# Patient Record
Sex: Male | Born: 1950 | Smoking: Never smoker
Health system: Southern US, Academic
[De-identification: ages and names within clinical notes are randomized; demographics above are authoritative.]

## PROBLEM LIST (undated history)

## (undated) DIAGNOSIS — L299 Pruritus, unspecified: Secondary | ICD-10-CM

## (undated) DIAGNOSIS — E785 Hyperlipidemia, unspecified: Secondary | ICD-10-CM

## (undated) DIAGNOSIS — I10 Essential (primary) hypertension: Secondary | ICD-10-CM

## (undated) DIAGNOSIS — F429 Obsessive-compulsive disorder, unspecified: Secondary | ICD-10-CM

## (undated) DIAGNOSIS — L210 Seborrhea capitis: Secondary | ICD-10-CM

## (undated) DIAGNOSIS — N3281 Overactive bladder: Secondary | ICD-10-CM

## (undated) HISTORY — DX: Hyperlipidemia, unspecified: E78.5

## (undated) HISTORY — DX: Seborrhea capitis: L21.0

## (undated) HISTORY — DX: Overactive bladder: N32.81

## (undated) HISTORY — PX: GASTRIC BYPASS: SHX52

## (undated) HISTORY — DX: Obsessive-compulsive disorder, unspecified: F42.9

## (undated) HISTORY — DX: Pruritus, unspecified: L29.9

## (undated) HISTORY — DX: Essential (primary) hypertension: I10

## (undated) HISTORY — PX: VASECTOMY: SHX75

---

## 2009-10-09 DIAGNOSIS — D352 Benign neoplasm of pituitary gland: Secondary | ICD-10-CM | POA: Insufficient documentation

## 2011-07-19 DIAGNOSIS — L299 Pruritus, unspecified: Secondary | ICD-10-CM | POA: Insufficient documentation

## 2011-11-26 DIAGNOSIS — F429 Obsessive-compulsive disorder, unspecified: Secondary | ICD-10-CM | POA: Insufficient documentation

## 2012-02-03 ENCOUNTER — Ambulatory Visit (INDEPENDENT_AMBULATORY_CARE_PROVIDER_SITE_OTHER): Payer: Medicare Other | Admitting: Family Medicine

## 2012-02-03 ENCOUNTER — Encounter: Payer: Self-pay | Admitting: Family Medicine

## 2012-02-03 VITALS — BP 114/76 | HR 81 | Ht 70.0 in | Wt 280.0 lb

## 2012-02-03 DIAGNOSIS — L299 Pruritus, unspecified: Secondary | ICD-10-CM

## 2012-02-03 DIAGNOSIS — L0212 Furuncle of neck: Secondary | ICD-10-CM

## 2012-02-03 DIAGNOSIS — L210 Seborrhea capitis: Secondary | ICD-10-CM | POA: Insufficient documentation

## 2012-02-03 HISTORY — DX: Seborrhea capitis: L21.0

## 2012-02-03 HISTORY — DX: Pruritus, unspecified: L29.9

## 2012-02-03 MED ORDER — MUPIROCIN 2 % EX OINT: TOPICAL_OINTMENT | CUTANEOUS | Status: DC

## 2012-02-03 MED ORDER — KETOCONAZOLE 2 % EX SHAM: MEDICATED_SHAMPOO | CUTANEOUS | Status: DC

## 2012-02-03 MED ORDER — SULFAMETHOXAZOLE-TRIMETHOPRIM 800-160 MG PO TABS: 1.0000 | ORAL_TABLET | Freq: Two times a day (BID) | ORAL | Status: AC

## 2012-02-03 MED ORDER — COAL TAR EXTRACT 0.5 % EX SHAM: MEDICATED_SHAMPOO | Freq: Every evening | CUTANEOUS | Status: DC | PRN

## 2012-02-03 MED ORDER — HYDROCODONE-ACETAMINOPHEN 5-325 MG PO TABS: 1.0000 | ORAL_TABLET | Freq: Four times a day (QID) | ORAL | Status: DC | PRN

## 2012-02-03 NOTE — Progress Notes (Signed)
Incision and Drainage Procedure Note  Pre-operative Diagnosis: Abscess of the neck  Post-operative Diagnosis: same  Indications: Infection  Anesthesia: 2% plain lidocaine with epinephrine  Procedure Details  The procedure, risks and complications have been discussed in detail (including, but not limited to airway compromise, infection, bleeding) with the patient, and the patient has signed consent to the procedure.  The skin was sterilely prepped and draped over the affected area in the usual fashion. After adequate local anesthesia, I&D with a #11 blade was performed on the abscess on the posterior neck Purulent drainage: present The patient was observed until stable.  Findings: Abscess of the neck  EBL: Less than 10  cc's  Drains: Iodoform gauze packing the wound  Condition: Tolerated procedure well   Complications: none.

## 2012-02-03 NOTE — Progress Notes (Signed)
  Subjective:    Patient ID: Kevin Stuart, male    DOB: 08-10-1951, 61 y.o.   MRN: [id number]  HPI Patient is here because for couple of issues.  #1 he's had a lesion in the backs neck that he states is been there for a few weeks. Every time he thinks things are getting better to notice some blood and pus on his pillowcase. There is some discomfort and pain he denies any fever  #2 he reports having itching and pruritus is quite noticeable. He states that he's been to the hypnotic therapist, dermatologist, previous primary care doctor and has been on multiple medications without improvement of the itching and scratching.   Review of Systems  Skin: Positive for rash.       Objective:   Physical Exam  Constitutional: He appears well-developed.  HENT:  Head: Normocephalic.  Neck: Neck supple. Erythema present. No edema present.         Abscess on the lower neck almost midline. There is a prominent scab present but the abscess is still rather fluctuant at this time.  Skin: Skin is warm. No rash noted. He is not diaphoretic. There is erythema. No pallor.       There is no particular rash present but no marked excoriations over his chest and back were used and scratching himself. Mild seborrhea is present in the scalp.          Assessment & Plan:  #1 abscess. See procedure note I&D performed packing placed. Patient is to have packing removed tomorrow evening. After the packing is been removed Bactroban ointment twice a day. Started on Septra DS one tablet twice a day culture the wound obtained #2 seborrhea dermatitis Nizoral shampoo after using T. gel shampoo 2-3 times a week need each one on about 5- 10 minutes. While  confident about the results warned him that it will probably take several weeks for improvement and resolution.

## 2012-02-03 NOTE — Patient Instructions (Signed)
Incision and Drainage of Abscess An abscess (boil or furuncle) is an area infected by germs that contains a collection of pus. Signs and problems (symptoms) of an abscess include pain, tenderness, redness, or hardness. You may feel a moveable, soft area under your skin. An abscess can occur anywhere in the body. Occasionally, this may spread to surrounding tissues causing cellulitis. Sometimes, a surgeon may make a cut (incision) over your abscess. The pus is drained. Gauze may be packed into the space to provide a drain. Keeping a drain or piece of gauze in the incision keeps the skin from healing first. This helps stop the abscess from forming again. The area may be painful for 5 to 7 days. Most people with an abscess do not have high fevers. If seen early, your abscess may not have localized and may not be cut. If it does not get better on its own or with medicines, you may require another appointment. HOME CARE INSTRUCTIONS   Only take over-the-counter or prescription medicines for pain, discomfort, or fever as directed by your caregiver. Use these only if your caregiver has not given medicines that would interfere.   When you bathe, remove the gauze drain after soaking. You may then wash the wound gently with mild, soapy water. Repack with gauze as your caregiver directs.   See your caregiver as directed for a recheck.   If antibiotics were prescribed, take them as directed.  SEEK MEDICAL CARE IF:   You develop increased pain, swelling, redness, drainage, or bleeding in the wound site.   You develop signs of generalized infection, including muscle aches, chills, or a general ill feeling.   You or your child has an oral temperature above 102 F (38.9 C).  MAKE SURE YOU:   Understand these instructions.   Will watch your condition.   Will get help right away if you are not doing well or get worse.  Document Released: 03/30/2001 Document Revised: 06/16/2011 Document Reviewed:  05/24/2008 [facility] Patient Information 2012 [place], [place].Abscess An abscess (boil or furuncle) is an infected area that contains a collection of pus.  SYMPTOMS Signs and symptoms of an abscess include pain, tenderness, redness, or hardness. You may feel a moveable soft area under your skin. An abscess can occur anywhere in the body.  TREATMENT  A surgical cut (incision) may be made over your abscess to drain the pus. Gauze may be packed into the space or a drain may be looped through the abscess cavity (pocket). This provides a drain that will allow the cavity to heal from the inside outwards. The abscess may be painful for a few days, but should feel much better if it was drained.  Your abscess, if seen early, may not have localized and may not have been drained. If not, another appointment may be required if it does not get better on its own or with medications. HOME CARE INSTRUCTIONS   Only take over-the-counter or prescription medicines for pain, discomfort, or fever as directed by your caregiver.   Take your antibiotics as directed if they were prescribed. Finish them even if you start to feel better.   Keep the skin and clothes clean around your abscess.   If the abscess was drained, you will need to use gauze dressing to collect any draining pus. Dressings will typically need to be changed 3 or more times a day.   The infection may spread by skin contact with others. Avoid skin contact as much as possible.  Practice good hygiene. This includes regular hand washing, cover any draining skin lesions, and do not share personal care items.   If you participate in sports, do not share athletic equipment, towels, whirlpools, or personal care items. Shower after every practice or tournament.   If a draining area cannot be adequately covered:   Do not participate in sports.   Children should not participate in day care until the wound has healed or drainage stops.   If your caregiver  has given you a follow-up appointment, it is very important to keep that appointment. Not keeping the appointment could result in a much worse infection, chronic or permanent injury, pain, and disability. If there is any problem keeping the appointment, you must call back to this facility for assistance.  SEEK MEDICAL CARE IF:   You develop increased pain, swelling, redness, drainage, or bleeding in the wound site.   You develop signs of generalized infection including muscle aches, chills, fever, or a general ill feeling.   You have an oral temperature above 102 F (38.9 C).  MAKE SURE YOU:   Understand these instructions.   Will watch your condition.   Will get help right away if you are not doing well or get worse.  Document Released: 07/14/2005 Document Revised: 09/23/2011 Document Reviewed: 05/07/2008 [facility] Patient Information 2012 [place], [place].Seborrheic Dermatitis Seborrheic dermatitis involves pink or red skin with greasy, flaky scales. This is often found on the scalp, eyebrows, nose, bearded area, and on or behind the ears. It can also occur on the central chest. It often occurs where there are more oil (sebaceous) glands. This condition is also known as dandruff. When this condition affects a baby's scalp, it is called cradle cap. It may come and go for no known reason. It can occur at any time of life from infancy to old age. CAUSES  The cause is unknown. It is not the result of too little moisture or too much oil. In some people, seborrheic dermatitis flare-ups seem to be triggered by stress. It also commonly occurs in people with certain diseases such as Parkinson's disease or HIV/AIDS. SYMPTOMS   Thick scales on the scalp.   Redness on the face or in the armpits.   The skin may seem oily or dry, but moisturizers do not help.   In infants, seborrheic dermatitis appears as scaly redness that does not seem to bother the baby. In some babies, it affects only the scalp.  In others, it also affects the neck creases, armpits, groin, or behind the ears.   In adults and adolescents, seborrheic dermatitis may affect only the scalp. It may look patchy or spread out, with areas of redness and flaking. Other areas commonly affected include:   Eyebrows.   Eyelids.   Forehead.   Skin behind the ears.   Outer ears.   Chest.   Armpits.   Nose creases.   Skin creases under the breasts.   Skin between the buttocks.   Groin.   Some adults and adolescents feel itching or burning in the affected areas.  DIAGNOSIS  Your caregiver can usually tell what the problem is by doing a physical exam. TREATMENT   Cortisone (steroid) ointments, creams, and lotions can help decrease inflammation.   Babies can be treated with baby oil to soften the scales, then they may be washed with baby shampoo. If this does not help, medicated shampoos should work.   Adults can also use medicated shampoos.   Your caregiver may  prescribe corticosteroid cream and shampoo containing an antifungal or yeast medicine (ketoconazole). Hydrocortisone or anti-yeast cream can be rubbed directly onto seborrheic dermatitis patches. Yeast does not cause seborrheic dermatitis, but it seems to add to the problem.  In infants, seborrheic dermatitis is often worst during the first year of life. It tends to disappear on its own as the child grows. However, it may return during the teenage years. In adults and adolescents, seborrheic dermatitis tends to be a long-lasting condition that comes and goes over many years. HOME CARE INSTRUCTIONS   Use prescribed medicines as directed.   In infants, do not aggressively remove the scales or flakes on the scalp with a comb or by other means. This may lead to hair loss.  SEEK MEDICAL CARE IF:   The problem does not improve from the medicated shampoos, lotions, or other medicines given by your caregiver.   You have any other questions or concerns.  Document  Released: 10/04/2005 Document Revised: 09/23/2011 Document Reviewed: 02/23/2010 [facility] Patient Information 2012 [place], [place].

## 2012-02-04 ENCOUNTER — Ambulatory Visit (INDEPENDENT_AMBULATORY_CARE_PROVIDER_SITE_OTHER): Payer: Medicare Other | Admitting: Family Medicine

## 2012-02-04 ENCOUNTER — Encounter: Payer: Self-pay | Admitting: Family Medicine

## 2012-02-04 VITALS — BP 109/68 | HR 59 | Ht 70.0 in | Wt 278.0 lb

## 2012-02-04 DIAGNOSIS — IMO0002 Reserved for concepts with insufficient information to code with codable children: Secondary | ICD-10-CM

## 2012-02-04 DIAGNOSIS — L299 Pruritus, unspecified: Secondary | ICD-10-CM

## 2012-02-04 DIAGNOSIS — Z9889 Other specified postprocedural states: Secondary | ICD-10-CM

## 2012-02-04 NOTE — Progress Notes (Signed)
  Subjective:    Patient ID: Kevin Stuart, male    DOB: 08/14/1951, 61 y.o.   MRN: [id number]  HPI The patient had a cyst removed from his neck yesterday by Dr. Thurmond Butts . He's here to have the wound re-evaluated and the gauze removed. He says it's tender but otherwise is doing well. Only minimal driange. No fever.    He also has some questions about his diffuse skin itching that has been chronic and going on for years. He seen multiple providers including allergists and dermatologists for it before. He has questions about DT consult shampoo as well as the tar shampoo application. He wants to think it will work. He will is basically asking me for my opinion. He never has any rash. He denies any redness or flaking of the scalp or skin.   Review of Systems     Objective:   Physical Exam  Skin:       No apparent rash on his back or chest or scalp. He has multiple excoriations across his abdomen. And some scarring from previous excoriations.    Review of gauze from the wound. It looks like it's healing well. He is still tender. No active pus or drainage at the gauze was removed. No evidence of cellulitis. I did repack the wound with a little less gauze. A used quarter inch. I left a tail on the gauze and it was covered with a dressing.      Assessment & Plan:  Wound, status post cyst removal-New gauze was placed today. I asked him if he had a family member or someone that can help her make the calls for him on Sunday and he said he would rather come in on Monday for have it removed. He can follow up on Monday to have it removed.  Pruritis - I do agree with Dr. Warden Fillers assessment that this could be seborrheic dermatitis. I discussed that sometimes this is caused by a small fungus that can live on the skin and I really do think it is worthwhile for him to at least consider and try this particular treatment. I reviewed the information that Dr. Thurmond Butts had discussed with him and encouraged him to try the  treatment regimen since he has already tried so many other things. Also patient does have a history of obsessive-compulsive disorder and certainly this could be completely psychological.

## 2012-02-07 ENCOUNTER — Ambulatory Visit: Payer: Medicare Other | Admitting: Family Medicine

## 2012-02-07 LAB — WOUND CULTURE

## 2012-02-08 ENCOUNTER — Encounter: Payer: Self-pay | Admitting: Family Medicine

## 2012-02-08 ENCOUNTER — Ambulatory Visit (INDEPENDENT_AMBULATORY_CARE_PROVIDER_SITE_OTHER): Payer: Medicare Other | Admitting: Family Medicine

## 2012-02-08 ENCOUNTER — Telehealth: Payer: Self-pay | Admitting: *Deleted

## 2012-02-08 VITALS — BP 113/72 | HR 71 | Ht 70.0 in | Wt 282.0 lb

## 2012-02-08 DIAGNOSIS — L0211 Cutaneous abscess of neck: Secondary | ICD-10-CM

## 2012-02-08 DIAGNOSIS — I10 Essential (primary) hypertension: Secondary | ICD-10-CM

## 2012-02-08 DIAGNOSIS — L299 Pruritus, unspecified: Secondary | ICD-10-CM

## 2012-02-08 DIAGNOSIS — Z2839 Other underimmunization status: Secondary | ICD-10-CM

## 2012-02-08 DIAGNOSIS — E785 Hyperlipidemia, unspecified: Secondary | ICD-10-CM

## 2012-02-08 DIAGNOSIS — Z283 Underimmunization status: Secondary | ICD-10-CM

## 2012-02-08 NOTE — Telephone Encounter (Signed)
Pt ask to informed you that his last physical was 02/18/09, colonoscopy was 01/14/11 by Dr. Copes and that he is due to go back 12/2013 and that his tetanus is not charted at his last physical but states he thinks he got it in [place] before moving here.  FYI

## 2012-02-08 NOTE — Patient Instructions (Signed)
Seborrheic Dermatitis Seborrheic dermatitis involves pink or red skin with greasy, flaky scales. This is often found on the scalp, eyebrows, nose, bearded area, and on or behind the ears. It can also occur on the central chest. It often occurs where there are more oil (sebaceous) glands. This condition is also known as dandruff. When this condition affects a baby's scalp, it is called cradle cap. It may come and go for no known reason. It can occur at any time of life from infancy to old age. CAUSES  The cause is unknown. It is not the result of too little moisture or too much oil. In some people, seborrheic dermatitis flare-ups seem to be triggered by stress. It also commonly occurs in people with certain diseases such as Parkinson's disease or HIV/AIDS. SYMPTOMS   Thick scales on the scalp.   Redness on the face or in the armpits.   The skin may seem oily or dry, but moisturizers do not help.   In infants, seborrheic dermatitis appears as scaly redness that does not seem to bother the baby. In some babies, it affects only the scalp. In others, it also affects the neck creases, armpits, groin, or behind the ears.   In adults and adolescents, seborrheic dermatitis may affect only the scalp. It may look patchy or spread out, with areas of redness and flaking. Other areas commonly affected include:   Eyebrows.   Eyelids.   Forehead.   Skin behind the ears.   Outer ears.   Chest.   Armpits.   Nose creases.   Skin creases under the breasts.   Skin between the buttocks.   Groin.   Some adults and adolescents feel itching or burning in the affected areas.  DIAGNOSIS  Your caregiver can usually tell what the problem is by doing a physical exam. TREATMENT   Cortisone (steroid) ointments, creams, and lotions can help decrease inflammation.   Babies can be treated with baby oil to soften the scales, then they may be washed with baby shampoo. If this does not help, medicated  shampoos should work.   Adults can also use medicated shampoos.   Your caregiver may prescribe corticosteroid cream and shampoo containing an antifungal or yeast medicine (ketoconazole). Hydrocortisone or anti-yeast cream can be rubbed directly onto seborrheic dermatitis patches. Yeast does not cause seborrheic dermatitis, but it seems to add to the problem.  In infants, seborrheic dermatitis is often worst during the first year of life. It tends to disappear on its own as the child grows. However, it may return during the teenage years. In adults and adolescents, seborrheic dermatitis tends to be a long-lasting condition that comes and goes over many years. HOME CARE INSTRUCTIONS   Use prescribed medicines as directed.   In infants, do not aggressively remove the scales or flakes on the scalp with a comb or by other means. This may lead to hair loss.  SEEK MEDICAL CARE IF:   The problem does not improve from the medicated shampoos, lotions, or other medicines given by your caregiver.   You have any other questions or concerns.  Document Released: 10/04/2005 Document Revised: 09/23/2011 Document Reviewed: 02/23/2010 [facility] Patient Information 2012 [place], [place].Seborrheic Dermatitis Seborrheic dermatitis involves pink or red skin with greasy, flaky scales. It often occurs where there are more oil (sebaceous) glands. This condition is also known as dandruff. When this condition affects a baby's scalp, it is called cradle cap. It may come and go for no known reason. It can  occur at any time of life from infancy to old age. TREATMENT  Cortisone (steroid) ointments, creams, and lotions can help decrease inflammation.   Babies can be treated with baby oil to soften the scales, then they may be washed with baby shampoo. If this does not help, medicated shampoos should work.   Adults can use medicated shampoos.   Your caregiver may prescribe corticosteroid cream and shampoo containing an  antifungal or yeast medicine (ketoconazole). Hydrocortisone or anti-yeast cream can be rubbed directly into seborrheic dermatitis patches. Yeast does not cause seborrheic dermatitis, but it seems to add to the problem.   In infants, do not aggressively remove the scales or flakes on the scalp with a comb or by other means. This may lead to hair loss.  SEEK MEDICAL CARE IF:   The problem does not improve from the medicated shampoos, lotions, or other medicines given by your caregiver.   You have any other questions or concerns.  Document Released: 11/11/2004 Document Revised: 09/23/2011 Document Reviewed: 02/23/2010 [facility] Patient Information 2012 [place], [place].

## 2012-02-08 NOTE — Progress Notes (Signed)
  Subjective:    Patient ID: Kevin Stuart, male    DOB: 1951/03/06, 61 y.o.   MRN: [id number]  HPI #1 abscess. Abscess was repacked Friday by Dr. Linford Arnold. He is taking the Septra twice a day but had questions about that medication and pain medication. Reiterated he is to take all of the Septra until gone and only use the pain medication as needed. Apparently he has been taking the Bactroban ointment and using it over his body for the itching. I've explained the Bactroban is only to be used over the boil. #2 pruritis. As above. He is putting the shampoo on and rinsing it off and then waiting 10 minutes. I've explained that he is to leave each of the shampoo on his scalp for 10 minutes and then rinse off. Also explained to him that it does take time to clear up seborrhea and that this is not going to stop the itching within one or 2 weeks.  #3 hypertension he did bring in his blood pressure medication which is Norvasc and Lotensin HCT #4 hyperlipidemia. He is on Zocor . #5 health maintenance review very fuzzy about past immunizations and procedures   Review of Systems  All other systems reviewed and are negative.      BP 113/72  Pulse 71  Ht 5\' 10"  (1.778 m)  Wt 282 lb (127.914 kg)  BMI 40.46 kg/m2 Objective:   Physical Exam  Constitutional: He appears well-developed and well-nourished.       Obese white male  Neck: Neck supple.       Packing was in the boil has come out. The abscess was almost closed over. A little pressure over the boil was successful in extruding a little bit of pus and blood. Overall doing well.  Cardiovascular: Normal rate and regular rhythm.   Pulmonary/Chest: Effort normal and breath sounds normal.  Neurological: He is alert.  Skin: Skin is warm.  Psychiatric: He has a normal mood and affect.      Assessment & Plan:  # 1 boil recheck. If he uses the Bactroban twice a day should be no need for followup at this time.   #2 pruritus. I've gone over instructions  for the shampoo hopefully we'll see improvement with this.   #3 hypertension blood pressures under good control. Continue current medications he denies needing any refills at this time return in 4 months.   #4 hyperlipidemia. Continue Zocor 40 to recheck his cholesterol when he returns in 4 months.  #5 health maintenance update. Try to document his last colonoscopy [facility] was within 6-12 months also for his physical he also states he was between 6-12 months, and the same for his tetanus. I've asked him to contact his previous primary care office and let us know for sure. This may raise the question of dementia/poor memory.   Addendum he called [place] back later to tell us his last PE was 02/18/2009, his last colonoscopy was 01/14/2011 and that his be P. colonoscopy is to be 3/15. His last tetanus was not as last physical but before he left [place].

## 2012-02-08 NOTE — Telephone Encounter (Signed)
Please ask him if he was aware that his prolactin levels were elevated on 12/06/2011 according to the records from the [facility] office and that it seems as if he should have a followup appointment with Dr. Betsy Coder endocrinologist at [facility] and if he needs for [place] to schedule  Visit we can do that as well. Thank you.

## 2012-02-09 NOTE — Telephone Encounter (Signed)
Pt notified and has a followup scheduled. KJ LPN

## 2012-02-10 ENCOUNTER — Ambulatory Visit: Payer: Medicare Other | Admitting: Family Medicine

## 2012-02-22 ENCOUNTER — Other Ambulatory Visit: Payer: Self-pay | Admitting: *Deleted

## 2012-02-22 ENCOUNTER — Telehealth: Payer: Self-pay | Admitting: Family Medicine

## 2012-02-22 DIAGNOSIS — E785 Hyperlipidemia, unspecified: Secondary | ICD-10-CM

## 2012-02-22 NOTE — Telephone Encounter (Signed)
Pt request to have a his med vytorin refilled at [facility].

## 2012-02-22 NOTE — Telephone Encounter (Signed)
Pt requesting a refill on Vytorin. Please advise.

## 2012-02-22 NOTE — Telephone Encounter (Signed)
Now he told Kevin Stuart when he was here as patient he was on Zocor. Was that a mistake? Vytorin  Is zocor and zetia combine. He will need to give [place] his dosage that he is taking and we need to confirm which medication he is really taking.

## 2012-02-22 NOTE — Telephone Encounter (Signed)
LM for pt to returncall

## 2012-02-23 NOTE — Telephone Encounter (Signed)
Addended by: Meyer Cory R on: 02/23/2012 09:55 AM   Modules accepted: Orders

## 2012-02-23 NOTE — Telephone Encounter (Signed)
Pt states that he has been taking Vytorin and Zocor everyday. States Vytorin dose is 10/20mg  and Zocor 20mg . Please advise on which medication you want him on so that I can send a refill.

## 2012-02-23 NOTE — Telephone Encounter (Signed)
Pt will get lab work done tomorrow and we will call with results and med refill.

## 2012-02-23 NOTE — Telephone Encounter (Signed)
I agree. Doesn't need to be on both but needs labs, CMP, lipids.

## 2012-02-24 LAB — COMPLETE METABOLIC PANEL WITH GFR
ALT: 28 U/L (ref 0–53)
AST: 25 U/L (ref 0–37)
Alkaline Phosphatase: 76 U/L (ref 39–117)
Creat: 1.02 mg/dL (ref 0.50–1.35)
Sodium: 143 mEq/L (ref 135–145)
Total Bilirubin: 0.5 mg/dL (ref 0.3–1.2)
Total Protein: 6.7 g/dL (ref 6.0–8.3)

## 2012-02-24 LAB — LIPID PANEL
HDL: 38 mg/dL — ABNORMAL LOW (ref >39)
LDL Cholesterol: 94 mg/dL (ref 0–99)
Total CHOL/HDL Ratio: 4.2 Ratio
Triglycerides: 143 mg/dL (ref <150)

## 2012-02-29 ENCOUNTER — Ambulatory Visit: Payer: Medicare Other | Admitting: Family Medicine

## 2012-03-07 ENCOUNTER — Ambulatory Visit (INDEPENDENT_AMBULATORY_CARE_PROVIDER_SITE_OTHER): Payer: Medicare Other | Admitting: Family Medicine

## 2012-03-07 ENCOUNTER — Encounter: Payer: Self-pay | Admitting: Family Medicine

## 2012-03-07 VITALS — BP 115/71 | HR 82 | Ht 70.0 in | Wt 277.0 lb

## 2012-03-07 DIAGNOSIS — E785 Hyperlipidemia, unspecified: Secondary | ICD-10-CM

## 2012-03-07 DIAGNOSIS — L299 Pruritus, unspecified: Secondary | ICD-10-CM

## 2012-03-07 DIAGNOSIS — I10 Essential (primary) hypertension: Secondary | ICD-10-CM

## 2012-03-07 DIAGNOSIS — Z23 Encounter for immunization: Secondary | ICD-10-CM

## 2012-03-07 HISTORY — DX: Essential (primary) hypertension: I10

## 2012-03-07 MED ORDER — DESLORATADINE 5 MG PO TABS: 5.0000 mg | ORAL_TABLET | Freq: Every day | ORAL | Status: DC

## 2012-03-07 MED ORDER — EZETIMIBE-SIMVASTATIN 10-40 MG PO TABS: 1.0000 | ORAL_TABLET | Freq: Every day | ORAL | Status: DC

## 2012-03-07 MED ORDER — RANITIDINE HCL 300 MG PO TABS: 300.0000 mg | ORAL_TABLET | Freq: Every day | ORAL | Status: DC

## 2012-03-07 MED ORDER — DOXEPIN HCL 150 MG PO CAPS: 150.0000 mg | ORAL_CAPSULE | Freq: Every day | ORAL | Status: DC

## 2012-03-07 NOTE — Progress Notes (Signed)
Subjective:    Patient ID: Kevin Stuart, male    DOB: 1951-10-09, 61 y.o.   MRN: [id number]  HPI  #1puritis reports him itching still present problem. Reports taking shampoo as prescribed on a regular basis and following my instructions this time with. Should be noted he sees a dermatologist and told the doctors told him was not much they can do since he cannot see a rash. #2 hypertension he reports no problem with current medication #3 hyperlipidemia. He wants renewal of his Vytorin 10/20 and the Zocor 20 mg #4 immunization update needed.  Review of Systems  Constitutional: Positive for activity change.  Skin: Positive for rash.       puritis      BP 115/71  Pulse 82  Ht 5\' 10"  (1.778 m)  Wt 277 lb (125.646 kg)  BMI 39.75 kg/m2  SpO2 99% Objective:   Physical Exam  Constitutional: He appears well-developed and well-nourished.       obese  HENT:  Head: Normocephalic.       Improvement of the seborrhea along the scalp but this told dandruff flakes present in his hair however much better  Neck: Normal range of motion. Neck supple.       Side of the abscess opened a few months ago looks markedly improved.  Cardiovascular: Normal rate and regular rhythm.   Pulmonary/Chest: Effort normal and breath sounds normal.  Musculoskeletal: Normal range of motion.  Neurological: He is alert.       Poor cognitive ability to understand some simple directions and instructions.  Skin: Skin is warm and dry. No rash noted.  Psychiatric: He has a normal mood and affect. His behavior is normal.      Results for orders placed in visit on 02/22/12  COMPLETE METABOLIC PANEL WITH GFR      Component Value Range   Sodium 143  135 - 145 (mEq/L)   Potassium 4.2  3.5 - 5.3 (mEq/L)   Chloride 106  96 - 112 (mEq/L)   CO2 26  19 - 32 (mEq/L)   Glucose, Bld 91  70 - 99 (mg/dL)   BUN 10  6 - 23 (mg/dL)   Creat 7.82  9.56 - 2.13 (mg/dL)   Total Bilirubin 0.5  0.3 - 1.2 (mg/dL)   Alkaline Phosphatase  76  39 - 117 (U/L)   AST 25  0 - 37 (U/L)   ALT 28  0 - 53 (U/L)   Total Protein 6.7  6.0 - 8.3 (g/dL)   Albumin 4.3  3.5 - 5.2 (g/dL)   Calcium 9.4  8.4 - 08.6 (mg/dL)   GFR, Est African American >89     GFR, Est Non African American 80    LIPID PANEL      Component Value Range   Cholesterol 161  0 - 200 (mg/dL)   Triglycerides 578  <469 (mg/dL)   HDL 38 (*) >62 (mg/dL)   Total CHOL/HDL Ratio 4.2     VLDL 29  0 - 40 (mg/dL)   LDL Cholesterol 94  0 - 99 (mg/dL)      Assessment & Plan:  #1 pruritus. We will recommend that he continue with tar shampoo and Nizoral shampoo 3 times a week what I will also do is try to provide maximum antihistamine blockade by adding doxepin 150, Zantac 300, and Clarinex (since he has failed Benadryl , Zyrtec and Claritin ) return in 2 months for followup.  #2 hypertension. Since his blood pressure looks as  good as this will maintain him on the Norvasc 10 mg and Lotensin 20/12.5.  #3 hyperlipidemia. Tried to explain to him he is taking Zetia 10/20 and the Zocor 20. Logic would dictate that we change it to Zetia at 10/40. When he returns in 2 months may want to consider at night another lipid panel at least a A1c.  #4immunization update. Patient needs tetanus injection.

## 2012-03-07 NOTE — Patient Instructions (Signed)
Seborrheic Dermatitis Seborrheic dermatitis involves pink or red skin with greasy, flaky scales. This is often found on the scalp, eyebrows, nose, bearded area, and on or behind the ears. It can also occur on the central chest. It often occurs where there are more oil (sebaceous) glands. This condition is also known as dandruff. When this condition affects a baby's scalp, it is called cradle cap. It may come and go for no known reason. It can occur at any time of life from infancy to old age. CAUSES  The cause is unknown. It is not the result of too little moisture or too much oil. In some people, seborrheic dermatitis flare-ups seem to be triggered by stress. It also commonly occurs in people with certain diseases such as Parkinson's disease or HIV/AIDS. SYMPTOMS  Thick scales on the scalp.  Redness on the face or in the armpits.  The skin may seem oily or dry, but moisturizers do not help.  In infants, seborrheic dermatitis appears as scaly redness that does not seem to bother the baby. In some babies, it affects only the scalp. In others, it also affects the neck creases, armpits, groin, or behind the ears.  In adults and adolescents, seborrheic dermatitis may affect only the scalp. It may look patchy or spread out, with areas of redness and flaking. Other areas commonly affected include:  Eyebrows.  Eyelids.  Forehead.  Skin behind the ears.  Outer ears.  Chest.  Armpits.  Nose creases.  Skin creases under the breasts.  Skin between the buttocks.  Groin.  Some adults and adolescents feel itching or burning in the affected areas.  DIAGNOSIS  Your caregiver can usually tell what the problem is by doing a physical exam. TREATMENT  Cortisone (steroid) ointments, creams, and lotions can help decrease inflammation.  Babies can be treated with baby oil to soften the scales, then they may be washed with baby shampoo. If this does not help, medicated shampoos should work.  Adults can also use  medicated shampoos.  Your caregiver may prescribe corticosteroid cream and shampoo containing an antifungal or yeast medicine (ketoconazole). Hydrocortisone or anti-yeast cream can be rubbed directly onto seborrheic dermatitis patches. Yeast does not cause seborrheic dermatitis, but it seems to add to the problem.  In infants, seborrheic dermatitis is often worst during the first year of life. It tends to disappear on its own as the child grows. However, it may return during the teenage years. In adults and adolescents, seborrheic dermatitis tends to be a long-lasting condition that comes and goes over many years. HOME CARE INSTRUCTIONS  Use prescribed medicines as directed.  In infants, do not aggressively remove the scales or flakes on the scalp with a comb or by other means. This may lead to hair loss.  SEEK MEDICAL CARE IF:  The problem does not improve from the medicated shampoos, lotions, or other medicines given by your caregiver.  You have any other questions or concerns.  Document Released: 10/04/2005 Document Revised: 09/23/2011 Document Reviewed: 02/23/2010 [facility] Patient Information 2012 [place], [place].Pruritus  Pruritis is an itch. There are many different problems that can cause an itch. Dry skin is one of the most common causes of itching. Most cases of itching do not require medical attention.  HOME CARE INSTRUCTIONS  Make sure your skin is moistened on a regular basis. A moisturizer that contains petroleum jelly is best for keeping moisture in your skin. If you develop a rash, you may try the following for relief:  Use corticosteroid cream.   Apply cool compresses to the affected areas.   Bathe with Epsom salts or baking soda in the bathwater.   Soak in colloidal oatmeal baths. These are available at your pharmacy.   Apply baking soda paste to the rash. Stir water into baking soda until it reaches a paste-like consistency.   Use an anti-itch lotion.   Take  over-the-counter diphenhydramine medicine by mouth as the instructions direct.   Avoid scratching. Scratching may cause the rash to become infected. If itching is very bad, your caregiver may suggest prescription lotions or creams to lessen your symptoms.   Avoid hot showers, which can make itching worse. A cold shower may help with itching as long as you use a moisturizer after the shower.  SEEK MEDICAL CARE IF: The itching does not go away after several days. Document Released: 06/16/2011 Document Revised: 09/23/2011 Document Reviewed: 06/16/2011 [facility] Patient Information 2012 [place], [place].Hypertriglyceridemia  Diet for High blood levels of Triglycerides Most fats in food are triglycerides. Triglycerides in your blood are stored as fat in your body. High levels of triglycerides in your blood may put you at a greater risk for heart disease and stroke.  Normal triglyceride levels are less than 150 mg/dL. Borderline high levels are 150-199 mg/dl. High levels are 200 - 499 mg/dL, and very high triglyceride levels are greater than 500 mg/dL. The decision to treat high triglycerides is generally based on the level. For people with borderline or high triglyceride levels, treatment includes weight loss and exercise. Drugs are recommended for people with very high triglyceride levels. Many people who need treatment for high triglyceride levels have metabolic syndrome. This syndrome is a collection of disorders that often include: insulin resistance, high blood pressure, blood clotting problems, high cholesterol and triglycerides. TESTING PROCEDURE FOR TRIGLYCERIDES  You should not eat 4 hours before getting your triglycerides measured. The normal range of triglycerides is between 10 and 250 milligrams per deciliter (mg/dl). Some people may have extreme levels (1000 or above), but your triglyceride level may be too high if it is above 150 mg/dl, depending on what other risk factors you have for heart  disease.   People with high blood triglycerides may also have high blood cholesterol levels. If you have high blood cholesterol as well as high blood triglycerides, your risk for heart disease is probably greater than if you only had high triglycerides. High blood cholesterol is one of the main risk factors for heart disease.  CHANGING YOUR DIET  Your weight can affect your blood triglyceride level. If you are more than 20% above your ideal body weight, you may be able to lower your blood triglycerides by losing weight. Eating less and exercising regularly is the best way to combat this. Fat provides more calories than any other food. The best way to lose weight is to eat less fat. Only 30% of your total calories should come from fat. Less than 7% of your diet should come from saturated fat. A diet low in fat and saturated fat is the same as a diet to decrease blood cholesterol. By eating a diet lower in fat, you may lose weight, lower your blood cholesterol, and lower your blood triglyceride level.  Eating a diet low in fat, especially saturated fat, may also help you lower your blood triglyceride level. Ask your dietitian to help you figure how much fat you can eat based on the number of calories your caregiver has prescribed for you.  Exercise, in  addition to helping with weight loss may also help lower triglyceride levels.   Alcohol can increase blood triglycerides. You may need to stop drinking alcoholic beverages.   Too much carbohydrate in your diet may also increase your blood triglycerides. Some complex carbohydrates are necessary in your diet. These may include bread, rice, potatoes, other starchy vegetables and cereals.   Reduce "simple" carbohydrates. These may include pure sugars, candy, honey, and jelly without losing other nutrients. If you have the kind of high blood triglycerides that is affected by the amount of carbohydrates in your diet, you will need to eat less sugar and less  high-sugar foods. Your caregiver can help you with this.   Adding 2-4 grams of fish oil (EPA+ DHA) may also help lower triglycerides. Speak with your caregiver before adding any supplements to your regimen.  Following the Diet  Maintain your ideal weight. Your caregivers can help you with a diet. Generally, eating less food and getting more exercise will help you lose weight. Joining a weight control group may also help. Ask your caregivers for a good weight control group in your area.  Eat low-fat foods instead of high-fat foods. This can help you lose weight too.  These foods are lower in fat. Eat MORE of these:   Dried beans, peas, and lentils.   Egg whites.   Low-fat cottage cheese.   Fish.   Lean cuts of meat, such as round, sirloin, rump, and flank (cut extra fat off meat you fix).   Whole grain breads, cereals and pasta.   Skim and nonfat dry milk.   Low-fat yogurt.   Poultry without the skin.   Cheese made with skim or part-skim milk, such as mozzarella, parmesan, farmers', ricotta, or pot cheese.  These are higher fat foods. Eat LESS of these:   Whole milk and foods made from whole milk, such as American, blue, cheddar, monterey jack, and swiss cheese   High-fat meats, such as luncheon meats, sausages, knockwurst, bratwurst, hot dogs, ribs, corned beef, ground pork, and regular ground beef.   Fried foods.  Limit saturated fats in your diet. Substituting unsaturated fat for saturated fat may decrease your blood triglyceride level. You will need to read package labels to know which products contain saturated fats.  These foods are high in saturated fat. Eat LESS of these:   Fried pork skins.   Whole milk.   Skin and fat from poultry.   Palm oil.   Butter.   Shortening.   Cream cheese.   Tomasa Blase.   Margarines and baked goods made from listed oils.   Vegetable shortenings.   Chitterlings.   Fat from meats.   Coconut oil.   Palm kernel oil.   Lard.     Cream.   Sour cream.   Fatback.   Coffee whiteners and non-dairy creamers made with these oils.   Cheese made from whole milk.  Use unsaturated fats (both polyunsaturated and monounsaturated) moderately. Remember, even though unsaturated fats are better than saturated fats; you still want a diet low in total fat.  These foods are high in unsaturated fat:   Canola oil.   Sunflower oil.   Mayonnaise.   Almonds.   Peanuts.   Pine nuts.   Margarines made with these oils.   Safflower oil.   Olive oil.   Avocados.   Cashews.   Peanut butter.   Sunflower seeds.   Soybean oil.   Peanut oil.   Olives.   Pecans.  Walnuts.   Pumpkin seeds.  Avoid sugar and other high-sugar foods. This will decrease carbohydrates without decreasing other nutrients. Sugar in your food goes rapidly to your blood. When there is excess sugar in your blood, your liver may use it to make more triglycerides. Sugar also contains calories without other important nutrients.  Eat LESS of these:   Sugar, brown sugar, powdered sugar, jam, jelly, preserves, honey, syrup, molasses, pies, candy, cakes, cookies, frosting, pastries, colas, soft drinks, punches, fruit drinks, and regular gelatin.   Avoid alcohol. Alcohol, even more than sugar, may increase blood triglycerides. In addition, alcohol is high in calories and low in nutrients. Ask for sparkling water, or a diet soft drink instead of an alcoholic beverage.  Suggestions for planning and preparing meals   Bake, broil, grill or roast meats instead of frying.   Remove fat from meats and skin from poultry before cooking.   Add spices, herbs, lemon juice or vinegar to vegetables instead of salt, rich sauces or gravies.   Use a non-stick skillet without fat or use no-stick sprays.   Cool and refrigerate stews and broth. Then remove the hardened fat floating on the surface before serving.   Refrigerate meat drippings and skim off fat to  make low-fat gravies.   Serve more fish.   Use less butter, margarine and other high-fat spreads on bread or vegetables.   Use skim or reconstituted non-fat dry milk for cooking.   Cook with low-fat cheeses.   Substitute low-fat yogurt or cottage cheese for all or part of the sour cream in recipes for sauces, dips or congealed salads.   Use half yogurt/half mayonnaise in salad recipes.   Substitute evaporated skim milk for cream. Evaporated skim milk or reconstituted non-fat dry milk can be whipped and substituted for whipped cream in certain recipes.   Choose fresh fruits for dessert instead of high-fat foods such as pies or cakes. Fruits are naturally low in fat.  When Dining Out   Order low-fat appetizers such as fruit or vegetable juice, pasta with vegetables or tomato sauce.   Select clear, rather than cream soups.   Ask that dressings and gravies be served on the side. Then use less of them.   Order foods that are baked, broiled, poached, steamed, stir-fried, or roasted.   Ask for margarine instead of butter, and use only a small amount.   Drink sparkling water, unsweetened tea or coffee, or diet soft drinks instead of alcohol or other sweet beverages.  QUESTIONS AND ANSWERS ABOUT OTHER FATS IN THE BLOOD: SATURATED FAT, TRANS FAT, AND CHOLESTEROL What is trans fat? Trans fat is a type of fat that is formed when vegetable oil is hardened through a process called hydrogenation. This process helps makes foods more solid, gives them shape, and prolongs their shelf life. Trans fats are also called hydrogenated or partially hydrogenated oils.  What do saturated fat, trans fat, and cholesterol in foods have to do with heart disease? Saturated fat, trans fat, and cholesterol in the diet all raise the level of LDL "bad" cholesterol in the blood. The higher the LDL cholesterol, the greater the risk for coronary heart disease (CHD). Saturated fat and trans fat raise LDL similarly.  What  foods contain saturated fat, trans fat, and cholesterol? High amounts of saturated fat are found in animal products, such as fatty cuts of meat, chicken skin, and full-fat dairy products like butter, whole milk, cream, and cheese, and in tropical vegetable oils such as  palm, palm kernel, and coconut oil. Trans fat is found in some of the same foods as saturated fat, such as vegetable shortening, some margarines (especially hard or stick margarine), crackers, cookies, baked goods, fried foods, salad dressings, and other processed foods made with partially hydrogenated vegetable oils. Small amounts of trans fat also occur naturally in some animal products, such as milk products, beef, and lamb. Foods high in cholesterol include liver, other organ meats, egg yolks, shrimp, and full-fat dairy products. How can I use the new food label to make heart-healthy food choices? Check the Nutrition Facts panel of the food label. Choose foods lower in saturated fat, trans fat, and cholesterol. For saturated fat and cholesterol, you can also use the Percent Daily Value (%DV): 5% DV or less is low, and 20% DV or more is high. (There is no %DV for trans fat.) Use the Nutrition Facts panel to choose foods low in saturated fat and cholesterol, and if the trans fat is not listed, read the ingredients and limit products that list shortening or hydrogenated or partially hydrogenated vegetable oil, which tend to be high in trans fat. POINTS TO REMEMBER: YOU NEED A LITTLE TLC (THERAPEUTIC LIFESTYLE CHANGES)  Discuss your risk for heart disease with your caregivers, and take steps to reduce risk factors.   Change your diet. Choose foods that are low in saturated fat, trans fat, and cholesterol.   Add exercise to your daily routine if it is not already being done. Participate in physical activity of moderate intensity, like brisk walking, for at least 30 minutes on most, and preferably all days of the week. No time? Break the 30  minutes into three, 10-minute segments during the day.   Stop smoking. If you do smoke, contact your caregiver to discuss ways in which they can help you quit.   Do not use street drugs.   Maintain a normal weight.   Maintain a healthy blood pressure.   Keep up with your blood work for checking the fats in your blood as directed by your caregiver.  Document Released: 07/22/2004 Document Revised: 09/23/2011 Document Reviewed: 02/17/2009 [facility] Patient Information 2012 [place], [place].

## 2012-03-23 ENCOUNTER — Encounter: Payer: Self-pay | Admitting: Family Medicine

## 2012-03-23 ENCOUNTER — Ambulatory Visit (INDEPENDENT_AMBULATORY_CARE_PROVIDER_SITE_OTHER): Payer: Medicare Other | Admitting: Family Medicine

## 2012-03-23 VITALS — BP 121/78 | HR 70 | Ht 70.0 in | Wt 273.0 lb

## 2012-03-23 DIAGNOSIS — R05 Cough: Secondary | ICD-10-CM

## 2012-03-23 DIAGNOSIS — I10 Essential (primary) hypertension: Secondary | ICD-10-CM

## 2012-03-23 DIAGNOSIS — F429 Obsessive-compulsive disorder, unspecified: Secondary | ICD-10-CM

## 2012-03-23 DIAGNOSIS — R059 Cough, unspecified: Secondary | ICD-10-CM

## 2012-03-23 DIAGNOSIS — L299 Pruritus, unspecified: Secondary | ICD-10-CM

## 2012-03-23 HISTORY — DX: Obsessive-compulsive disorder, unspecified: F42.9

## 2012-03-23 NOTE — Patient Instructions (Signed)
Drug Toxicity You are having a reaction to your medicine. This does not mean you are allergic to the drug. Medicines can have many different side effects and toxic reactions. These include:  Stomach symptoms, such as nausea, vomiting, cramps, diarrhea, bloating, constipation, and dry mouth.   Nervous system symptoms, such as weakness, muscle spasms, drowsiness, confusion, agitation, and headache.   Heart and blood vessel symptoms, such as fainting, irregular heartbeat (palpitations), and fast heartbeat.   Skin symptoms, such as itching, light sensitivity, and rashes.  When taking more medicines, there is an increased chance of a drug interaction that can make you sick. Take your medicines as your caregiver recommends. Keep a list of the names and doses of each of your drugs. Avoid alcohol and street drugs. Call your caregiver if you are worried about drug side effects or toxicity. Document Released: 10/04/2005 Document Revised: 09/23/2011 Document Reviewed: 03/21/2007 [facility] Patient Information 2012 [place], [place].Cough, Adult  A cough is a reflex that helps clear your throat and airways. It can help heal the body or may be a reaction to an irritated airway. A cough may only last 2 or 3 weeks (acute) or may last more than 8 weeks (chronic).  CAUSES Acute cough:  Viral or bacterial infections.  Chronic cough:  Infections.   Allergies.   Asthma.   Post-nasal drip.   Smoking.   Heartburn or acid reflux.   Some medicines.   Chronic lung problems (COPD).   Cancer.  SYMPTOMS   Cough.   Fever.   Chest pain.   Increased breathing rate.   High-pitched whistling sound when breathing (wheezing).   Colored mucus that you cough up (sputum).  TREATMENT   A bacterial cough may be treated with antibiotic medicine.   A viral cough must run its course and will not respond to antibiotics.   Your caregiver may recommend other treatments if you have a chronic cough.  HOME CARE  INSTRUCTIONS   Only take over-the-counter or prescription medicines for pain, discomfort, or fever as directed by your caregiver. Use cough suppressants only as directed by your caregiver.   Use a cold steam vaporizer or humidifier in your bedroom or home to help loosen secretions.   Sleep in a semi-upright position if your cough is worse at night.   Rest as needed.   Stop smoking if you smoke.  SEEK IMMEDIATE MEDICAL CARE IF:   You have pus in your sputum.   Your cough starts to worsen.   You cannot control your cough with suppressants and are losing sleep.   You begin coughing up blood.   You have difficulty breathing.   You develop pain which is getting worse or is uncontrolled with medicine.   You have a fever.  MAKE SURE YOU:   Understand these instructions.   Will watch your condition.   Will get help right away if you are not doing well or get worse.  Document Released: 04/02/2011 Document Revised: 09/23/2011 Document Reviewed: 04/02/2011 [facility] Patient Information 2012 [place], [place].

## 2012-03-23 NOTE — Progress Notes (Signed)
  Subjective:    Patient ID: Kevin Stuart, male    DOB: 13-Oct-1951, 61 y.o.   MRN: [id number]  HPI  Patient is here because of his concern that the medication he is taking may be causing him to itch. It turns out that he is on several medications and supplements as well as well as possible taking medication other doctors have prescribed. He states that none of the medications that he has been prescribed either by me,dermatologist, his acupuncturist, or previous PCP has helped the itching states was come off those medication . #2 hypertension #3 for the first time patient has told me he actually has OCD.  Review of Systems  Respiratory: Positive for cough.        Has been going on for several days now. He states he does take up some over-the-counter cough medication but does not want another prescription medication.  All other systems reviewed and are negative.     BP 121/78  Pulse 70  Ht 5\' 10"  (1.778 m)  Wt 273 lb (123.832 kg)  BMI 39.17 kg/m2 Objective:   Physical Exam  Vitals reviewed. Constitutional: He is oriented to person, place, and time.       Obese white male.  HENT:  Head: Normocephalic.  Neck: Neck supple.  Cardiovascular: Normal rate, regular rhythm and normal heart sounds.   Pulmonary/Chest: Effort normal and breath sounds normal. No respiratory distress.       Actively coughing  Neurological: He is alert and oriented to person, place, and time.  Skin: Skin is warm and dry.  Psychiatric: His mood appears anxious.       Patient often demonstrates her to be a nervous twitch.      Assessment & Plan:  #1 review medication done. Several interesting problems the first being that he still taking Zocor and Vytorin. Stressed to him that the 10/40 Vytorin is all of the cholesterol medicine but he needs down and they should not be taking the Zocor 20 at all. As far as the supplements from the acupuncturist and other supplements is taking such as a OTC multi-vitamin, D3,  garlic, vitamin C and other supplements I recommend he stop those see if he feels better without them then with them. We will stop all of his antihistamines prescribed by me and his previous PCP such as the ranitidine, Clarinex, Atarax, and the doxepin. He still has Vicodin tablets and he definitely does not need those pills anymore. He has a bottle of Neurontin that he would need to check with the dermatologist   that prescribed that for him to see if he should continue it anymore  #2 hypertension. Blood pressure is under good control. Continue the Norvasc and the Lotensin HCTZ 20/12.5 #3 OCD. Stress I would continue the BuSpar , Paxil and other medication prescribed by the psychiatrist.   #4 cough he declines medications time but will let me know if he gets worse.

## 2012-05-16 ENCOUNTER — Telehealth: Payer: Self-pay | Admitting: Family Medicine

## 2012-05-16 MED ORDER — BENAZEPRIL-HYDROCHLOROTHIAZIDE 20-12.5 MG PO TABS: 1.0000 | ORAL_TABLET | Freq: Every day | ORAL | Status: DC

## 2012-05-16 MED ORDER — AMLODIPINE BESYLATE 10 MG PO TABS: 10.0000 mg | ORAL_TABLET | Freq: Every day | ORAL | Status: DC

## 2012-05-16 NOTE — Telephone Encounter (Signed)
Patient request to know if his two prescriptions Amlodipine Besylate and Benazepril called into [facility] in [place] instead of having mail ordered.

## 2012-05-16 NOTE — Telephone Encounter (Signed)
Pt is requesting refills on Amolodipine and Benazepril and looks as if we have never filled. Please advise if I can refill.

## 2012-06-08 ENCOUNTER — Ambulatory Visit (INDEPENDENT_AMBULATORY_CARE_PROVIDER_SITE_OTHER): Payer: Medicare Other | Admitting: Family Medicine

## 2012-06-08 ENCOUNTER — Encounter: Payer: Self-pay | Admitting: Family Medicine

## 2012-06-08 VITALS — BP 119/79 | HR 59 | Wt 269.0 lb

## 2012-06-08 DIAGNOSIS — E785 Hyperlipidemia, unspecified: Secondary | ICD-10-CM

## 2012-06-08 DIAGNOSIS — F4320 Adjustment disorder, unspecified: Secondary | ICD-10-CM

## 2012-06-08 DIAGNOSIS — I10 Essential (primary) hypertension: Secondary | ICD-10-CM

## 2012-06-08 DIAGNOSIS — R634 Abnormal weight loss: Secondary | ICD-10-CM

## 2012-06-08 NOTE — Patient Instructions (Signed)
Itching Itching is a symptom that can be caused by many things. These include skin problems (including infections) as well as some internal diseases.  If the itching is affecting just one area of the body, it is most likely due to a common skin problem, such as:  Poison oak and poison ivy.   Contact dermatitis (skin irritation from a plant, chemicals, fiberglass, detergents, new cosmetic, new jewelry, or other substance).   Fungus (such as athlete's foot, jock itch, or ringworm).   Head lice   Dandruff   Insect bite   Infection (such as Shingles or other virus infections).  If the itching is all over (widespread), the possible causes are many. These include:   Dry skin or eczema   Heat rash   Hives   Liver disorders   Kidney disorders  TREATMENT  Localized itching   Lubrication of the skin. Use an ointment or cream or other unperfumed moisturizers if the skin is dry. Apply frequently, especially after bathing.   Anti-itch medicines. These medications may help control the urge to scratch. Scratching always makes itching worse and increases the chance of getting an infection.   Cortisone creams and ointments. These help reduce the inflammation.   Antibiotics. Skin infections can cause itching. Topical or oral antibiotics may be needed for 10 to 20 days to get rid of an infection.  If you can identify what caused the itching, avoid this substance in the future.  Widespread itching  The following measures may help to relieve itching regardless of the cause:   Wash the skin once with soap to remove irritants.   Bathe in tepid water with baking soda, cornstarch, or oatmeal.   Use calamine lotion (nonprescription) or a baking soda solution (1 teaspoon in 4 ounces of water on the skin).   Apply 1% hydrocortisone cream (no prescription needed). Do not use this if there might be a skin infection.   Avoid scratching.   Avoid itchy or tight-fitting clothes.   Avoid excessive  heat, sweating, scented soaps, and swimming pools.   The lubricants, anti-itch medicines, etc. noted above may be helpful for controlling symptoms.  SEEK MEDICAL CARE IF:   The itching becomes severe.   Your itch is not better after 1 week of treatment. Contact your caregiver to schedule further evaluation.  Document Released: 10/04/2005 Document Revised: 09/23/2011 Document Reviewed: 03/24/2007 [facility] Patient Information 2012 [place], [place].Pruritus  Pruritis is an itch. There are many different problems that can cause an itch. Dry skin is one of the most common causes of itching. Most cases of itching do not require medical attention.  HOME CARE INSTRUCTIONS  Make sure your skin is moistened on a regular basis. A moisturizer that contains petroleum jelly is best for keeping moisture in your skin. If you develop a rash, you may try the following for relief:   Use corticosteroid cream.   Apply cool compresses to the affected areas.   Bathe with Epsom salts or baking soda in the bathwater.   Soak in colloidal oatmeal baths. These are available at your pharmacy.   Apply baking soda paste to the rash. Stir water into baking soda until it reaches a paste-like consistency.   Use an anti-itch lotion.   Take over-the-counter diphenhydramine medicine by mouth as the instructions direct.   Avoid scratching. Scratching may cause the rash to become infected. If itching is very bad, your caregiver may suggest prescription lotions or creams to lessen your symptoms.   Avoid hot showers,  which can make itching worse. A cold shower may help with itching as long as you use a moisturizer after the shower.  SEEK MEDICAL CARE IF: The itching does not go away after several days. Document Released: 06/16/2011 Document Revised: 09/23/2011 Document Reviewed: 06/16/2011 [facility] Patient Information 2012 [place], [place].

## 2012-06-08 NOTE — Progress Notes (Signed)
Subjective:    Patient ID: Kevin Stuart, male    DOB: 1951/07/27, 61 y.o.   MRN: [id number]  HPI #1 Hypertension #2 Hyperlidemia #3 Puritis #4 situational disturbences   Review of Systems  Constitutional: Positive for unexpected weight change. Negative for activity change and appetite change.  Skin:       puritis  Neurological: Negative for weakness.   BP 119/79  Pulse 59  Wt 269 lb (122.018 kg)    Objective:   Physical Exam  Vitals reviewed. Constitutional: He is oriented to person, place, and time. He appears well-developed and well-nourished.       Obese WM  HENT:  Head: Normocephalic and atraumatic.  Neck: Normal range of motion. Neck supple.  Cardiovascular: Normal rate and regular rhythm.   Pulmonary/Chest: Effort normal and breath sounds normal.  Musculoskeletal: Normal range of motion.  Neurological: He is alert and oriented to person, place, and time.  Skin: Skin is warm and dry.  Psychiatric: He has a normal mood and affect. His behavior is normal.          Results for orders placed in visit on 02/22/12  COMPLETE METABOLIC PANEL WITH GFR      Component Value Range   Sodium 143  135 - 145 mEq/L   Potassium 4.2  3.5 - 5.3 mEq/L   Chloride 106  96 - 112 mEq/L   CO2 26  19 - 32 mEq/L   Glucose, Bld 91  70 - 99 mg/dL   BUN 10  6 - 23 mg/dL   Creat 4.09  8.11 - 9.14 mg/dL   Total Bilirubin 0.5  0.3 - 1.2 mg/dL   Alkaline Phosphatase 76  39 - 117 U/L   AST 25  0 - 37 U/L   ALT 28  0 - 53 U/L   Total Protein 6.7  6.0 - 8.3 g/dL   Albumin 4.3  3.5 - 5.2 g/dL   Calcium 9.4  8.4 - 78.2 mg/dL   GFR, Est African American >89     GFR, Est Non African American 80    LIPID PANEL      Component Value Range   Cholesterol 161  0 - 200 mg/dL   Triglycerides 956  <213 mg/dL   HDL 38 (*) >08 mg/dL   Total CHOL/HDL Ratio 4.2     VLDL 29  0 - 40 mg/dL   LDL Cholesterol 94  0 - 99 mg/dL    Assessment & Plan:   #1 Hypertension. Under great control. Will  maintain the 3 medication in 2 pills. The Norvasc 10 mg And Lotensin 20/12.5. . #2 hyperlipidemia. Reviewing his lab work cholesterol looks great LDL is 94. At this time no further blood work but continue the Vytorin 10/40. We will probably get blood work next visit. #3 pruritus. Unfortunately this as not really gotten better we'll eliminate multiple medications last time he is here. He is taking 3 medications from the psychiatrist and is taking about 3 medications from his dermatologist and they had no answer was causing the itching and at this point time we'll continue to watch him but I'm not going to add any new medications #4 situation disturbance. He reports that the friction between him and his wife has improved unfortunately his brother-in-law was psychiatric problems has been [place] for while in some type of psychotic state is poorly responsive. He and his wife had to provide all the care to her markedly disabled brother who's blind but wheelchair-bound  and is a brittle diabetic. #5 weight loss he lost about 8 pounds which is good. She's not sure why or how he's lost weight which I am as somewhat concerned about. However since there is no change in energy level or fatigue at this time we'll keep in on him and not make any changes she returns in 4 months.  Return in 4 months.

## 2012-06-13 ENCOUNTER — Ambulatory Visit (INDEPENDENT_AMBULATORY_CARE_PROVIDER_SITE_OTHER): Payer: Medicare Other | Admitting: Family Medicine

## 2012-06-13 ENCOUNTER — Encounter: Payer: Self-pay | Admitting: Family Medicine

## 2012-06-13 VITALS — BP 120/80 | HR 70 | Ht 70.0 in | Wt 273.0 lb

## 2012-06-13 DIAGNOSIS — L299 Pruritus, unspecified: Secondary | ICD-10-CM

## 2012-06-13 DIAGNOSIS — N509 Disorder of male genital organs, unspecified: Secondary | ICD-10-CM

## 2012-06-13 MED ORDER — CLOTRIMAZOLE-BETAMETHASONE 1-0.05 % EX CREA: TOPICAL_CREAM | Freq: Two times a day (BID) | CUTANEOUS | Status: DC

## 2012-06-13 MED ORDER — CYPROHEPTADINE HCL 4 MG PO TABS: 4.0000 mg | ORAL_TABLET | Freq: Four times a day (QID) | ORAL | Status: DC | PRN

## 2012-06-13 NOTE — Progress Notes (Signed)
Subjective:    Patient ID: Kevin Stuart, male    DOB: 1951-01-02, 61 y.o.   MRN: [id number]  HPI  Concern about pruritic medication causing kidney or liver problems. He states that he brought the 3 medications that he is taking for his itching because he wanted me to review them. They are Benadryl which she takes 1-3 times a day, Zyrtec 10 mg. And Periactin 4 mg 4 times a day. He states that he use he will take 2 tablets of the Periactin twice a day. He has also informed me that he is a refill of his Periactin since his dermatologist who has prescribed this medicine before apparently no longer accepts he is current insurance.  Last 3 days he has seen blood in his scrotal area. He does not see blood in his urine but when he weeks at the morning he is been seen blood in his underwear. He also sees blood during the day but describes no pain. Having any blood in her stool he has not seen any blood in his stool either  .  Review of Systems  Genitourinary:       Scrotal bleeding  All other systems reviewed and are negative.        BP 120/80  Pulse 70  Ht 5\' 10"  (1.778 m)  Wt 273 lb (123.832 kg)  BMI 39.17 kg/m2  SpO2 99% Objective:   Physical Exam  Vitals reviewed. Constitutional:       obese white male with some involuntary movement of his head and neck which is not unusual for him.  HENT:  Head: Normocephalic.  Genitourinary:       Scrotal sac was inspected patient has multiple capillary like vessels on his scrotum. They appear to be somewhat irritated which makes me suspicious that patient may have been not and scratching the area rubbing the area with his legs or thighs.      Results for orders placed in visit on 02/22/12  COMPLETE METABOLIC PANEL WITH GFR      Component Value Range   Sodium 143  135 - 145 mEq/L   Potassium 4.2  3.5 - 5.3 mEq/L   Chloride 106  96 - 112 mEq/L   CO2 26  19 - 32 mEq/L   Glucose, Bld 91  70 - 99 mg/dL   BUN 10  6 - 23 mg/dL   Creat 8.65  7.84 -  6.96 mg/dL   Total Bilirubin 0.5  0.3 - 1.2 mg/dL   Alkaline Phosphatase 76  39 - 117 U/L   AST 25  0 - 37 U/L   ALT 28  0 - 53 U/L   Total Protein 6.7  6.0 - 8.3 g/dL   Albumin 4.3  3.5 - 5.2 g/dL   Calcium 9.4  8.4 - 29.5 mg/dL   GFR, Est African American >89     GFR, Est Non African American 80    LIPID PANEL      Component Value Range   Cholesterol 161  0 - 200 mg/dL   Triglycerides 284  <132 mg/dL   HDL 38 (*) >44 mg/dL   Total CHOL/HDL Ratio 4.2     VLDL 29  0 - 40 mg/dL   LDL Cholesterol 94  0 - 99 mg/dL   Assessment & Plan:  #1 the patient request I did look at recommendations and side effects of the Periactin cannot really see where he will cause either liver or kidney problems. In fact we'll go  ahead and renew his Periactin prescription.  #2 scrotal bleeding. Not sure whether these blood vessels are secondary to irritation i.e. fungal infection, scratching or something else. I will treat Lotrisone cream which helped with inflammation and fungal infection but initially I plan to send him to a dermatologist for possible biopsy but since his dermatologist no longer takes his insurance appetite constipation will send to urology to see the urologist can help [place] figure this out and we'll try to get one that they will accept his medical insurance.

## 2012-06-13 NOTE — Patient Instructions (Signed)
Vasculitis Vasculitis is when your blood vessels are inflamed. There are many different blood vessels in the body, and vasculitis can affect any of them. This includes large (veins and arteries) and small (capillaries) vessels. With vasculitis,   Blood vessel walls can become thick.   Blood vessels can become narrow.   Blood vessels can become weak. Sometimes, it becomes so weak that the blood vessel bulges out like a balloon. This is called an aneurysm. Aneurysms are rare but can be life-threatening.   Scarring can occur.   Not enough blood can flow through the blood vessels.  All of these things can damage many parts of the body, including the muscles, kidneys, lungs and brain. There are many types of vasculitis. Some types are short-term (acute), while others are long-term (chronic). Some types may go away without treatment, and others may need to be treated for a long time.  CAUSES  Vasculitis occurs when the body's immune system (which fights germs and disease) makes a mistake. It attacks its own blood vessels. This causes inflammation (the body's way of reacting to injury or infection).   Why this happens is usually not known. The condition is then called primary vasculitis.   Sometimes, something triggers the inflammation. This is called secondary vasculitis. Possible causes include:   Infections.   An immune system disease. Examples include lupus, rheumatoid arthritis and scleroderma.   An allergic reaction to a medicine.   Cancer that affects blood cells. This includes leukemia and lymphoma.   Males and females of all ages and races can develop vasculitis. Some risk factors make vasculitis more likely. These include:   Smoking.   Stress.   Physical injury.  SYMPTOMS  There are more than 20 types of vasculitis. Symptoms of each type vary, but some symptoms are common.  Many people with vasculitis:   Have a fever.   Do not feel like eating.   Lose weight.   Feel  very tired.   Have aches and pains.   Feel weak.   Start to not have feeling (numbness) in an area.   Symptoms for some types of vasculitis also could be:   Sores in the mouth or eyes.   Skin problems. This could be sores, spots or rashes.   Trouble seeing.   Trouble breathing.   Blood in the urine.   Headaches.   Pain in the abdomen.   Stuffy or bloody nose.  DIAGNOSIS  Vasculitis symptoms are similar to symptoms of many other conditions. That can make it hard to tell if you have vasculitis. To be sure, your caregiver will ask about your symptoms and do a physical exam. Certain tests may be necessary, such as:   A complete blood count (CBC). This test shows how many red blood cells are in your blood. Not having enough red blood cells (anemic) can result from vasculitis.   Erythrocyte sedimentation (also called sed rate test). It measures inflammation in the body.   C reactive protein (CRP). This also shows if there is inflammation.   Anti-neutrophil cytoplasmic antibodies (ANCA). This can tell if the immune system is reacting to certain cells in the blood.   A urine test. This checks for blood or protein in the urine. That could be a sign of kidney damage from vasculitis.   Imaging tests. These tests create pictures from inside the body. Options include:   X-rays.   Computed tomography (CT) scan. This uses X-rays guided by a computer.   Ultrasound. It   creates an image using sound waves.   Magnetic resonance imaging (MRI). It uses radio waves, magnets and a computer.   Angiography. A dye is put into your blood vessels. Then, an X-ray is taken of them.   A biopsy of a blood vessel. This means your caregiver will take out a small piece of a blood vessel. Then, it is checked under a microscope. This is an important test. It often is the best way to know for sure if you have vasculitis.  TREATMENT   Treatment will depend on the type of vasculitis and how severe it is.  Often, you will need to see a specialist in immunologic diseases (rheumatologist).   Some types of vasculitis may go away without treatment.   Some types need only over-the-counter drugs.   Prescription medicines are used to treat many types of vasculitis. For example:   Corticosteroids. These are the drugs used most often. They are very powerful. Usually, a high dose is taken until symptoms improve. Then, the dose is gradually decreased. Using corticosteroids for a long time can cause problems. They can make muscles and bones weak. They can cause blood pressure to go up, and cause diabetes. Also, people often gain weight when they take corticosteroids.   Cytotoxic drugs. These kill cells that cause inflammation. Sometimes, they are used if corticosteroids do not help. Other times, both medications are taken.   Surgery. This may be needed to repair a blood vessel that has bulged out (aneurysm).   Treatment can sometimes cure your disease. Other times, it can put the disease in remission (no symptoms). Increased treatment and reevaluation might be necessary if your disease comes back or flares.  HOME CARE INSTRUCTIONS   Take any medications that your caregiver prescribes. Follow the directions carefully.   Watch for any problems that can be caused by a drug (side effects). Tell your caregiver right away if you notice any changes or problems.   Keep all appointments for checkups. This is important to help your caregiver watch for side effects. Checkups may include:   Periodic blood tests.   Bone density testing. This checks how strong or weak your bones are.   Blood pressure checks. If your blood pressure rises, you may need to take a drug to control it while you are taking corticosteroids.   Blood sugar checks. This is to be sure you are not developing diabetes. If you have diabetes, corticosteroid medications may make it worse and require increased treatment.   Exercise. First, talk  with your caregiver about what would be OK for you to do. Aerobic exercise (which increases your heart rate) is often suggested. It includes walking. This type of exercise is good because it helps prevent bone loss. It also helps control your blood pressure.   Follow a healthy diet. Include good sources of protein in your diet. Also include fruits, vegetables and whole grains. Your caregiver can refer you to an expert on healthy eating (dietitian) for more detailed advice.   Learn as much as you can about vasculitis. Understanding your condition can help you cope with it. Coping can be hard because this may be something you will have to live with for years.   Consider joining a support group. It often helps to talk about your worries with others who have the same problems.   Tell your caregiver if you feel stressed, anxious or depressed. Your caregiver may refer you to a specialist, or recommend medication to relieve your symptoms.    SEEK MEDICAL CARE IF:   The symptoms that led to your diagnosis return.   You develop worsening fever, fatigue, headache, weight loss or pain in your jaw.   You develop signs of infection. Infections can be worse if you are on corticosteroid medication.   You develop any new or unexplained symptoms of disease.  SEEK IMMEDIATE MEDICAL CARE IF:   Your eyesight changes.   Pain does not go away, even after taking medication.   You feel pain in your chest or abdomen.   You have trouble breathing.   One side of your face or body becomes suddenly weak or numb.   Your nose bleeds.   There is blood in your urine.   You develop a fever of more than 102 F (38.9 C).  Document Released: 07/31/2009 Document Revised: 09/23/2011 Document Reviewed: 07/31/2009 [facility] Patient Information 2012 [place], [place].Jock Itch Jock itch is a fungal infection of the skin in the groin area. It is sometimes called "ringworm" even though it is not caused by a worm. A fungus is a  type of germ that thrives in dark, damp places.  CAUSES  This infection may spread from:  A fungus infection elsewhere on the body (such as athlete's foot).   Sharing towels or clothing.  This infection is more common in:  Hot, humid climates.   People who wear tight-fitting clothing or wet bathing suits for long periods of time.   Athletes.   Overweight people.   People with diabetes.  SYMPTOMS  Jock itch causes the following symptoms:  Red, pink or brown rash in the groin. Rash may spread to the thighs, anus, and buttocks.   Itching.  DIAGNOSIS  Your caregiver may make the diagnosis by looking at the rash. Sometimes a skin scraping will be sent to test for fungus. Testing can be done either by looking under the microscope or by doing a culture (test to try to grow the fungus). A culture can take up to 2 weeks to come back. TREATMENT  Jock itch may be treated with:  Skin cream or ointment to kill fungus.   Medicine by mouth to kill fungus.   Skin cream or ointment to calm the itching.   Compresses or medicated powders to dry the infected skin.  HOME CARE INSTRUCTIONS   Be sure to treat the rash completely. Follow your caregiver's instructions. It can take a couple of weeks to treat. If you do not treat the infection long enough, the rash can come back.   Wear loose-fitting clothing.   Men should wear cotton boxer shorts.   Women should wear cotton underwear.   Avoid hot baths.   Dry the groin area well after bathing.  SEEK MEDICAL CARE IF:   Your rash is worse.   Your rash is spreading.   Your rash returns after treatment is finished.   Your rash is not gone in 4 weeks. Fungal infections are slow to respond to treatment. Some redness may remain for several weeks after the fungus is gone.  SEEK IMMEDIATE MEDICAL CARE IF:  The area becomes red, warm, tender, and swollen.   You have a fever.  Document Released: 09/24/2002 Document Revised: 09/23/2011  Document Reviewed: 08/23/2008 [facility] Patient Information 2012 [place], [place].

## 2012-07-04 ENCOUNTER — Ambulatory Visit (INDEPENDENT_AMBULATORY_CARE_PROVIDER_SITE_OTHER): Payer: Medicare Other | Admitting: Family Medicine

## 2012-07-04 ENCOUNTER — Encounter: Payer: Self-pay | Admitting: Family Medicine

## 2012-07-04 VITALS — BP 112/70 | HR 75 | Ht 70.0 in | Wt 269.0 lb

## 2012-07-04 DIAGNOSIS — L299 Pruritus, unspecified: Secondary | ICD-10-CM

## 2012-07-04 HISTORY — DX: Pruritus, unspecified: L29.9

## 2012-07-04 MED ORDER — METHYLPREDNISOLONE SODIUM SUCC 125 MG IJ SOLR
125.0000 mg | Freq: Once | INTRAMUSCULAR | Status: AC
  Administered 2012-07-04: 125 mg via INTRAMUSCULAR

## 2012-07-04 MED ORDER — METHYLPREDNISOLONE SODIUM SUCC 125 MG IJ SOLR: 125.0000 mg | Freq: Once | INTRAMUSCULAR | Status: DC

## 2012-07-04 MED ORDER — FEXOFENADINE HCL 180 MG PO TABS: 180.0000 mg | ORAL_TABLET | Freq: Every day | ORAL | Status: DC

## 2012-07-04 MED ORDER — CYPROHEPTADINE HCL 4 MG PO TABS: 8.0000 mg | ORAL_TABLET | Freq: Four times a day (QID) | ORAL | Status: DC | PRN

## 2012-07-04 MED ORDER — RANITIDINE HCL 300 MG PO TABS: 300.0000 mg | ORAL_TABLET | Freq: Every day | ORAL | Status: DC

## 2012-07-04 NOTE — Progress Notes (Signed)
  Subjective:    Patient ID: Kevin Stuart, male    DOB: [birth year], 61 y.o.   MRN: [id number]  HPI  Patient is here because of increased itching. She's taking his cyproheptadine 4 mg 4 times a day, which is the prescribed dosage, he is taking his Benadryl #10 25 mg twice a day for total of 500 mg daily. He is taking his Zyrtec 10 mg 2 to 3 a day. He states that while this medication initially seemed to help the itching now as not doing anything and he is miserable scratching and clawing. Review of Systems  Psychiatric/Behavioral: Positive for dysphoric mood. The patient is nervous/anxious.      BP 112/70  Pulse 75  Ht 5\' 10"  (1.778 m)  Wt 269 lb (122.018 kg)  BMI 38.60 kg/m2  SpO2 97% Objective:   Physical Exam  Skin: Skin is warm, dry and intact. No lesion and no rash noted. He is not diaphoretic. No erythema. No pallor.  Psychiatric: His mood appears anxious. Cognition and memory are impaired. He does not express impulsivity. He exhibits a depressed mood.    patient is obviously in discomfort blinking his eyes repeatedly , rubbing his face and scratching.     Assessment & Plan:   pruritus out of control.  #1 the cyproheptadine is the only medicine that he is taking that we have some margin to increase. To treat anorexia nervosa we could increase it to 8 mg 4 times a day. I would recommend we increase it to 2 tablets 3 times a day and after one week he can increase it to 2 tablets 4 times a day if needed.   obviously he has developed a tolerance to the Zyrtec and Benadryl. I recommend we stop both completely. I will put him on Zantac 300 twice a day, Allegra 180 daily and I will give him an injection of Solu-Medrol 125 today as well. Plan see him back in 2 weeks to 3 weeks for reevaluation..  I did express my concern this dose of Benadryl he was taking was very dangerous in may of explained some of the head with and blinking that he is having. He did explain to me that he has nervous  tics. Still don't think that much Benadryl helped that either.  Lastly he is not currently seeing a dermatologist because his last one no longer accepts or is on his insurance plan I have informed him that if he finds a dermatologist that will accept his insurance plan I am glad to make that referral through the office.

## 2012-07-04 NOTE — Patient Instructions (Addendum)
Pruritus  Pruritis is an itch. There are many different problems that can cause an itch. Dry skin is one of the most common causes of itching. Most cases of itching do not require medical attention.  HOME CARE INSTRUCTIONS  Make sure your skin is moistened on a regular basis. A moisturizer that contains petroleum jelly is best for keeping moisture in your skin. If you develop a rash, you may try the following for relief:   Use corticosteroid cream.   Apply cool compresses to the affected areas.   Bathe with Epsom salts or baking soda in the bathwater.   Soak in colloidal oatmeal baths. These are available at your pharmacy.   Apply baking soda paste to the rash. Stir water into baking soda until it reaches a paste-like consistency.   Use an anti-itch lotion.   Take over-the-counter diphenhydramine medicine by mouth as the instructions direct.   Avoid scratching. Scratching may cause the rash to become infected. If itching is very bad, your caregiver may suggest prescription lotions or creams to lessen your symptoms.   Avoid hot showers, which can make itching worse. A cold shower may help with itching as long as you use a moisturizer after the shower.  SEEK MEDICAL CARE IF: The itching does not go away after several days. Document Released: 06/16/2011 Document Revised: 09/23/2011 Document Reviewed: 06/16/2011 [facility] Patient Information 2012 [place], [place].

## 2012-07-25 ENCOUNTER — Ambulatory Visit: Payer: Medicare Other | Admitting: Family Medicine

## 2012-08-17 ENCOUNTER — Encounter: Payer: Self-pay | Admitting: Family Medicine

## 2012-08-17 ENCOUNTER — Ambulatory Visit: Payer: Medicare Other | Admitting: Family Medicine

## 2012-08-17 ENCOUNTER — Ambulatory Visit (INDEPENDENT_AMBULATORY_CARE_PROVIDER_SITE_OTHER): Payer: Medicare Other | Admitting: Family Medicine

## 2012-08-17 VITALS — BP 103/63 | HR 66 | Ht 70.0 in | Wt 263.0 lb

## 2012-08-17 DIAGNOSIS — F429 Obsessive-compulsive disorder, unspecified: Secondary | ICD-10-CM

## 2012-08-17 DIAGNOSIS — L259 Unspecified contact dermatitis, unspecified cause: Secondary | ICD-10-CM

## 2012-08-17 DIAGNOSIS — I10 Essential (primary) hypertension: Secondary | ICD-10-CM

## 2012-08-17 DIAGNOSIS — L309 Dermatitis, unspecified: Secondary | ICD-10-CM

## 2012-08-17 MED ORDER — CLOTRIMAZOLE-BETAMETHASONE 1-0.05 % EX CREA: TOPICAL_CREAM | CUTANEOUS | Status: DC

## 2012-08-17 NOTE — Progress Notes (Signed)
  Subjective:    Patient ID: Kevin Stuart, male    DOB: 24-Mar-1951, 61 y.o.   MRN: [id number]  HPI #1 pruritus. He reports that the itching is much better. However he reports that his psychiatrist Dr. Dub Mikes changed multiple medications or she's not sure of the exact medication or dosage. I it has been a change with the itching. He was taking BuSpar and Paxil. Both those were stopped. Apparently in [place] he was on higher dose of Lexapro and Abilify or his only taking 20 mg Lexapro and 15 of Abilify now.  #2 OCD. See above.   #3 hypertension. He has also lost several pounds since having a medication change.  #4 dermatitis of the fingertips. He reports cracking of the finger tips painful after having pressure washing done on his house. He states he informed person not to use chemicals other than bleach whatever the individual used it left a residue on his house but he later had to scrub off and subsequently is when he started having the cracking of the finger tips.  #5 immunization flu shot needed   Review of Systems  Constitutional: Positive for activity change and unexpected weight change.  Psychiatric/Behavioral: Negative for behavioral problems and agitation. The patient is not nervous/anxious.       BP 103/63  Pulse 66  Ht 5\' 10"  (1.778 m)  Wt 263 lb (119.296 kg)  BMI 37.74 kg/m2  SpO2 97% Objective:   Physical Exam  Constitutional: He is oriented to person, place, and time. He appears well-developed and well-nourished.  HENT:  Head: Normocephalic.  Neurological: He is alert and oriented to person, place, and time.  Skin: Skin is warm and dry. Rash noted.       Tips of fingers are breaking and cracking chronic irritation  Psychiatric: He has a normal mood and affect. He is agitated and is hyperactive. He is not withdrawn and not actively hallucinating. He expresses impulsivity.       Assessment & Plan:  #1 pruritus./OCD. Glad to see the pruritus problem finally resolved but  I am concerned the patient has lost weight, and seems much more animated than  usual. Try to reconcile his medication as best as I can but he is not sure of the dosing that he is on right now. He does see his psychiatrist Dr. Dub Mikes. November we'll have him followup with him.   #2 hypertension. We'll reduce his Zestoretic from a whole tablet to a half a tablet. Continue with the Norvasc and we'll have him return in one month for nurse's visit to recheck his blood pressure.  #3 flu vaccination he declines  #4 dermatitis. While I feel this is probably more fungal than anything else because he relates it to after he had his house pressure washed and residual chemicals left on the house I will place him on Lotrisone cream 2 times a day and followup in a few weeks if not better.   Return in 2 months for regular visit. Patient informed of this provider departure from this practice.

## 2012-08-17 NOTE — Patient Instructions (Addendum)
A return in one month for blood pressure recheck and then 2 months for regular visit      Calorie Counting Diet A calorie counting diet requires you to eat the number of calories that are right for you in a day. Calories are the measurement of how much energy you get from the food you eat. Eating the right amount of calories is important for staying at a healthy weight. If you eat too many calories, your body will store them as fat and you may gain weight. If you eat too few calories, you may lose weight. Counting the number of calories you eat during a day will help you know if you are eating the right amount. A Registered Dietitian can determine how many calories you need in a day. The amount of calories needed varies from person to person. If your goal is to lose weight, you will need to eat fewer calories. Losing weight can benefit you if you are overweight or have health problems such as heart disease, high blood pressure, or diabetes. If your goal is to gain weight, you will need to eat more calories. Gaining weight may be necessary if you have a certain health problem that causes your body to need more energy. TIPS Whether you are increasing or decreasing the number of calories you eat during a day, it may be hard to get used to changes in what you eat and drink. The following are tips to help you keep track of the number of calories you eat.  Measure foods at home with measuring cups. This helps you know the amount of food and number of calories you are eating.  Restaurants often serve food in amounts that are larger than 1 serving. While eating out, estimate how many servings of a food you are given. For example, a serving of cooked rice is  cup or about the size of half of a fist. Knowing serving sizes will help you be aware of how much food you are eating at restaurants.  Ask for smaller portion sizes or child-size portions at restaurants.  Plan to eat half of a meal at a restaurant.  Take the rest home or share the other half with a friend.  Read the Nutrition Facts panel on food labels for calorie content and serving size. You can find out how many servings are in a package, the size of a serving, and the number of calories each serving has.  For example, a package might contain 3 cookies. The Nutrition Facts panel on that package says that 1 serving is 1 cookie. Below that, it will say there are 3 servings in the container. The calories section of the Nutrition Facts label says there are 90 calories. This means there are 90 calories in 1 cookie (1 serving). If you eat 1 cookie you have eaten 90 calories. If you eat all 3 cookies, you have eaten 270 calories (3 servings x 90 calories = 270 calories). The list below tells you how big or small some common portion sizes are.  1 oz.........4 stacked dice.  3 oz........Marland KitchenDeck of cards.  1 tsp.......Marland KitchenTip of little finger.  1 tbs......Marland KitchenMarland KitchenThumb.  2 tbs.......Marland KitchenGolf ball.   cup......Marland KitchenHalf of a fist.  1 cup.......Marland KitchenA fist. KEEP A FOOD LOG Write down every food item you eat, the amount you eat, and the number of calories in each food you eat during the day. At the end of the day, you can add up the total number of calories you  have eaten. It may help to keep a list like the one below. Find out the calorie information by reading the Nutrition Facts panel on food labels. Breakfast  Bran cereal (1 cup, 110 calories).  Fat-free milk ( cup, 45 calories). Snack  Apple (1 medium, 80 calories). Lunch  Spinach (1 cup, 20 calories).  Tomato ( medium, 20 calories).  Chicken breast strips (3 oz, 165 calories).  Shredded cheddar cheese ( cup, 110 calories).  Light [place] dressing (2 tbs, 60 calories).  Whole-wheat bread (1 slice, 80 calories).  Tub margarine (1 tsp, 35 calories).  Vegetable soup (1 cup, 160 calories). Dinner  Pork chop (3 oz, 190 calories).  Brown rice (1 cup, 215 calories).  Steamed broccoli (  cup, 20 calories).  Strawberries (1  cup, 65 calories).  Whipped cream (1 tbs, 50 calories). Daily Calorie Total: 1425 Document Released: 10/04/2005 Document Revised: 12/27/2011 Document Reviewed: 03/31/2007 [facility] Patient Information 2013 [place], [place].

## 2012-08-22 ENCOUNTER — Ambulatory Visit (INDEPENDENT_AMBULATORY_CARE_PROVIDER_SITE_OTHER): Payer: Medicare Other | Admitting: Family Medicine

## 2012-08-22 ENCOUNTER — Ambulatory Visit (INDEPENDENT_AMBULATORY_CARE_PROVIDER_SITE_OTHER): Payer: Medicare Other

## 2012-08-22 ENCOUNTER — Encounter: Payer: Self-pay | Admitting: Family Medicine

## 2012-08-22 VITALS — BP 100/61 | HR 88 | Ht 70.0 in | Wt 264.0 lb

## 2012-08-22 DIAGNOSIS — Z131 Encounter for screening for diabetes mellitus: Secondary | ICD-10-CM

## 2012-08-22 DIAGNOSIS — R739 Hyperglycemia, unspecified: Secondary | ICD-10-CM

## 2012-08-22 DIAGNOSIS — R634 Abnormal weight loss: Secondary | ICD-10-CM

## 2012-08-22 DIAGNOSIS — H9190 Unspecified hearing loss, unspecified ear: Secondary | ICD-10-CM

## 2012-08-22 DIAGNOSIS — M79609 Pain in unspecified limb: Secondary | ICD-10-CM

## 2012-08-22 DIAGNOSIS — R7309 Other abnormal glucose: Secondary | ICD-10-CM

## 2012-08-22 DIAGNOSIS — M79671 Pain in right foot: Secondary | ICD-10-CM

## 2012-08-22 DIAGNOSIS — M79672 Pain in left foot: Secondary | ICD-10-CM

## 2012-08-22 DIAGNOSIS — H9193 Unspecified hearing loss, bilateral: Secondary | ICD-10-CM

## 2012-08-22 LAB — POCT GLYCOSYLATED HEMOGLOBIN (HGB A1C): Hemoglobin A1C: 5.6

## 2012-08-22 IMAGING — CR DG FOOT COMPLETE 3+V*R*
3 series · 3 of 3 positions shown · non-contrast
Comparison: Left foot performed today.

CLINICAL DATA: Foot pain.  No injury.

RIGHT FOOT COMPLETE - 3+ VIEW

[view not recorded (1 of 3)]
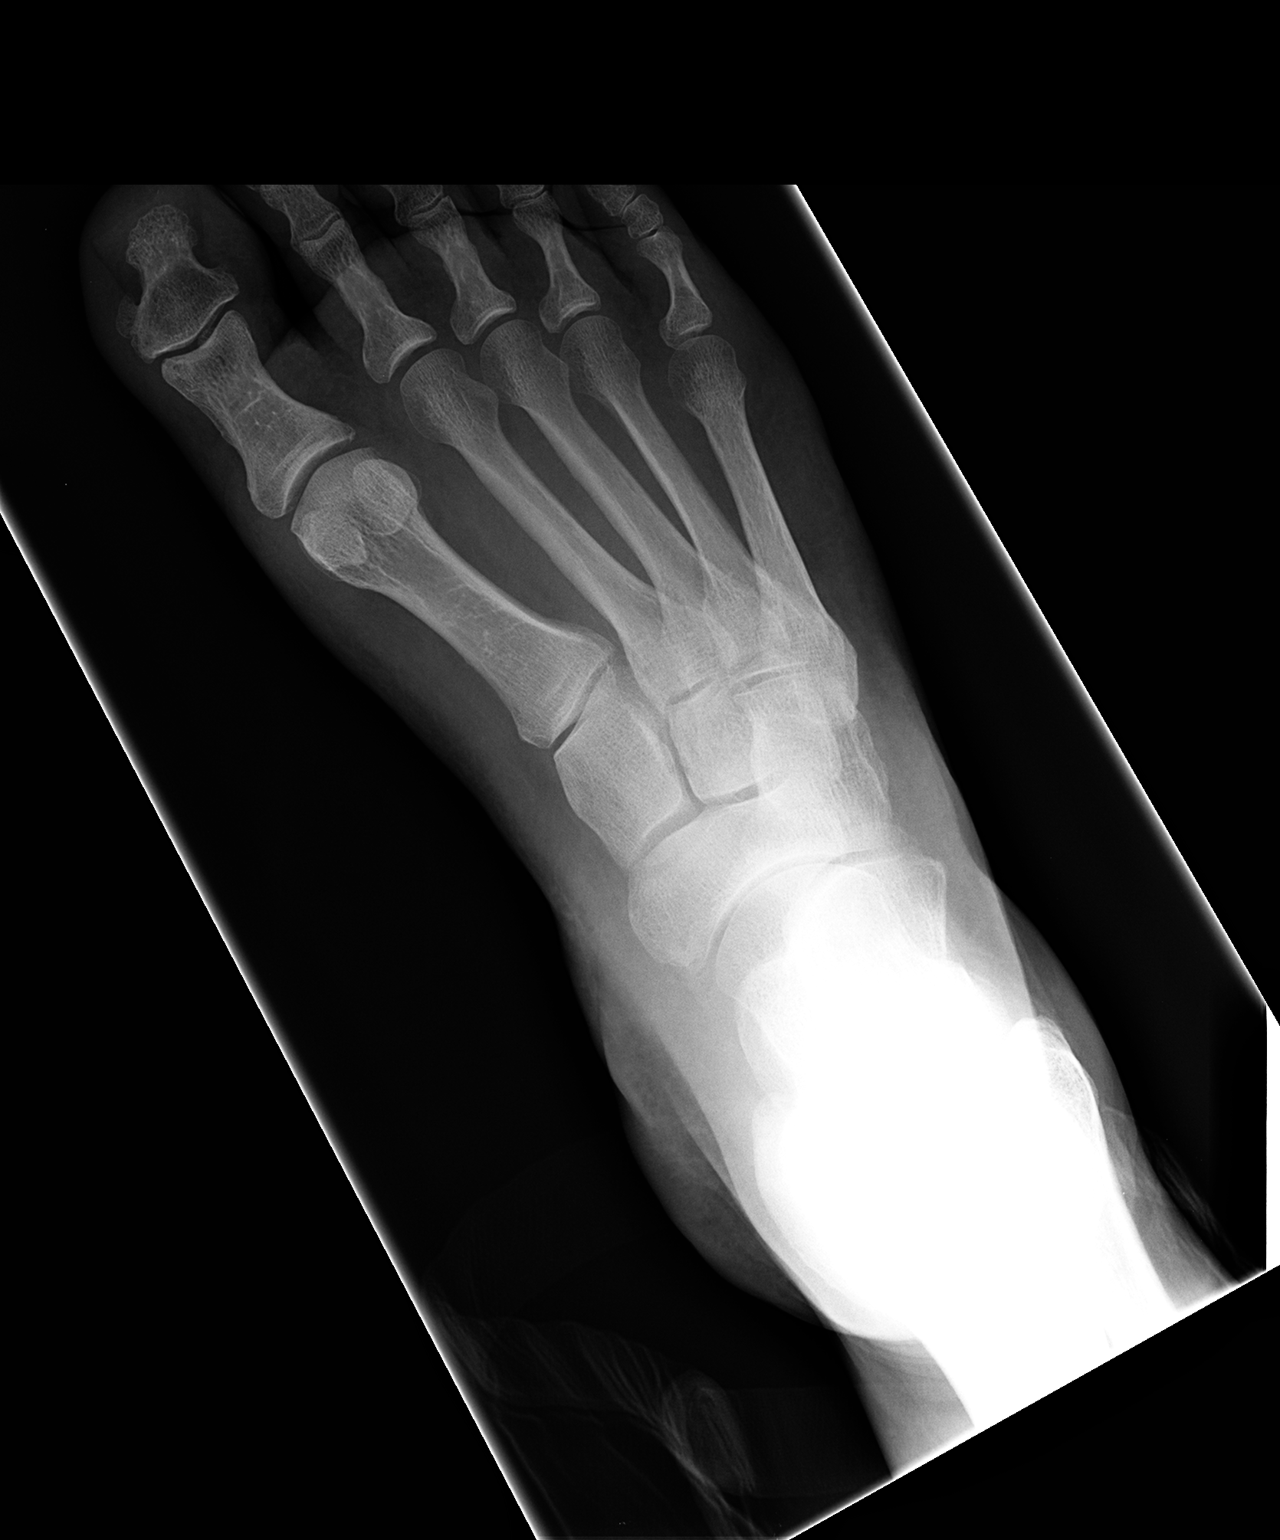

[view not recorded (2 of 3)]
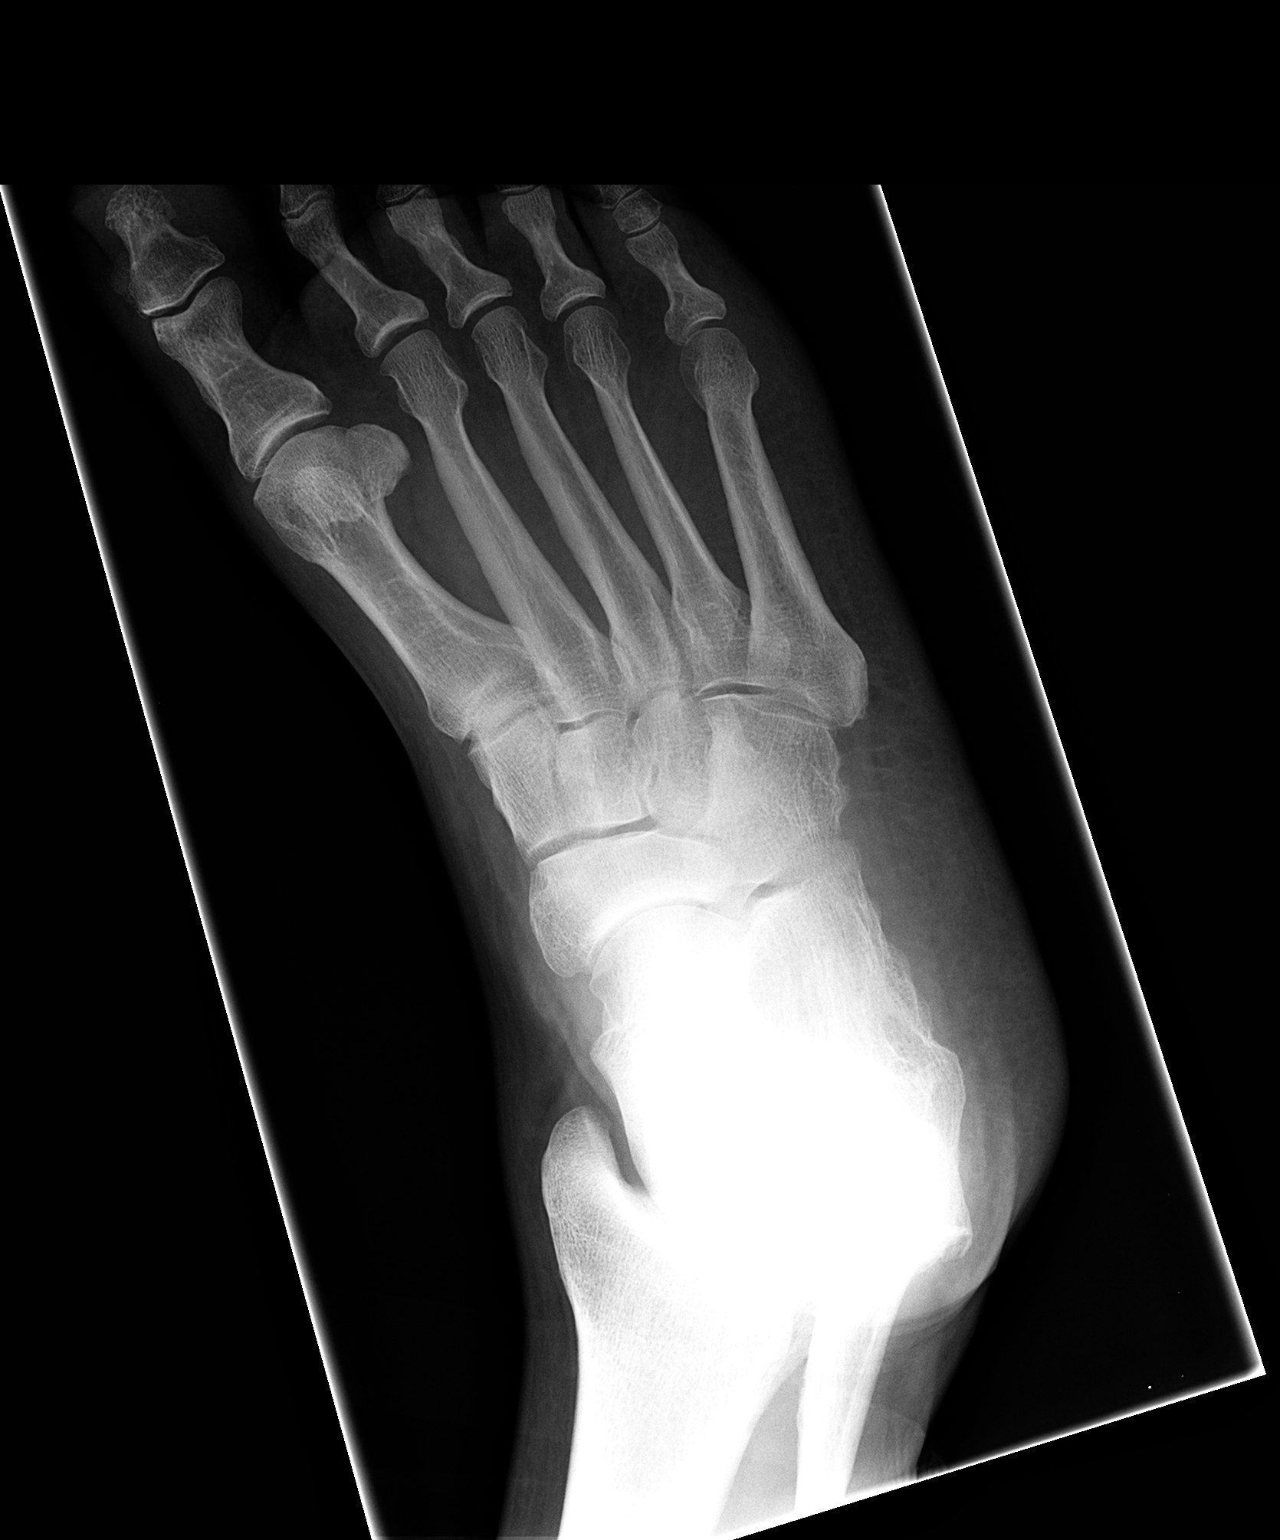

[view not recorded (3 of 3)]
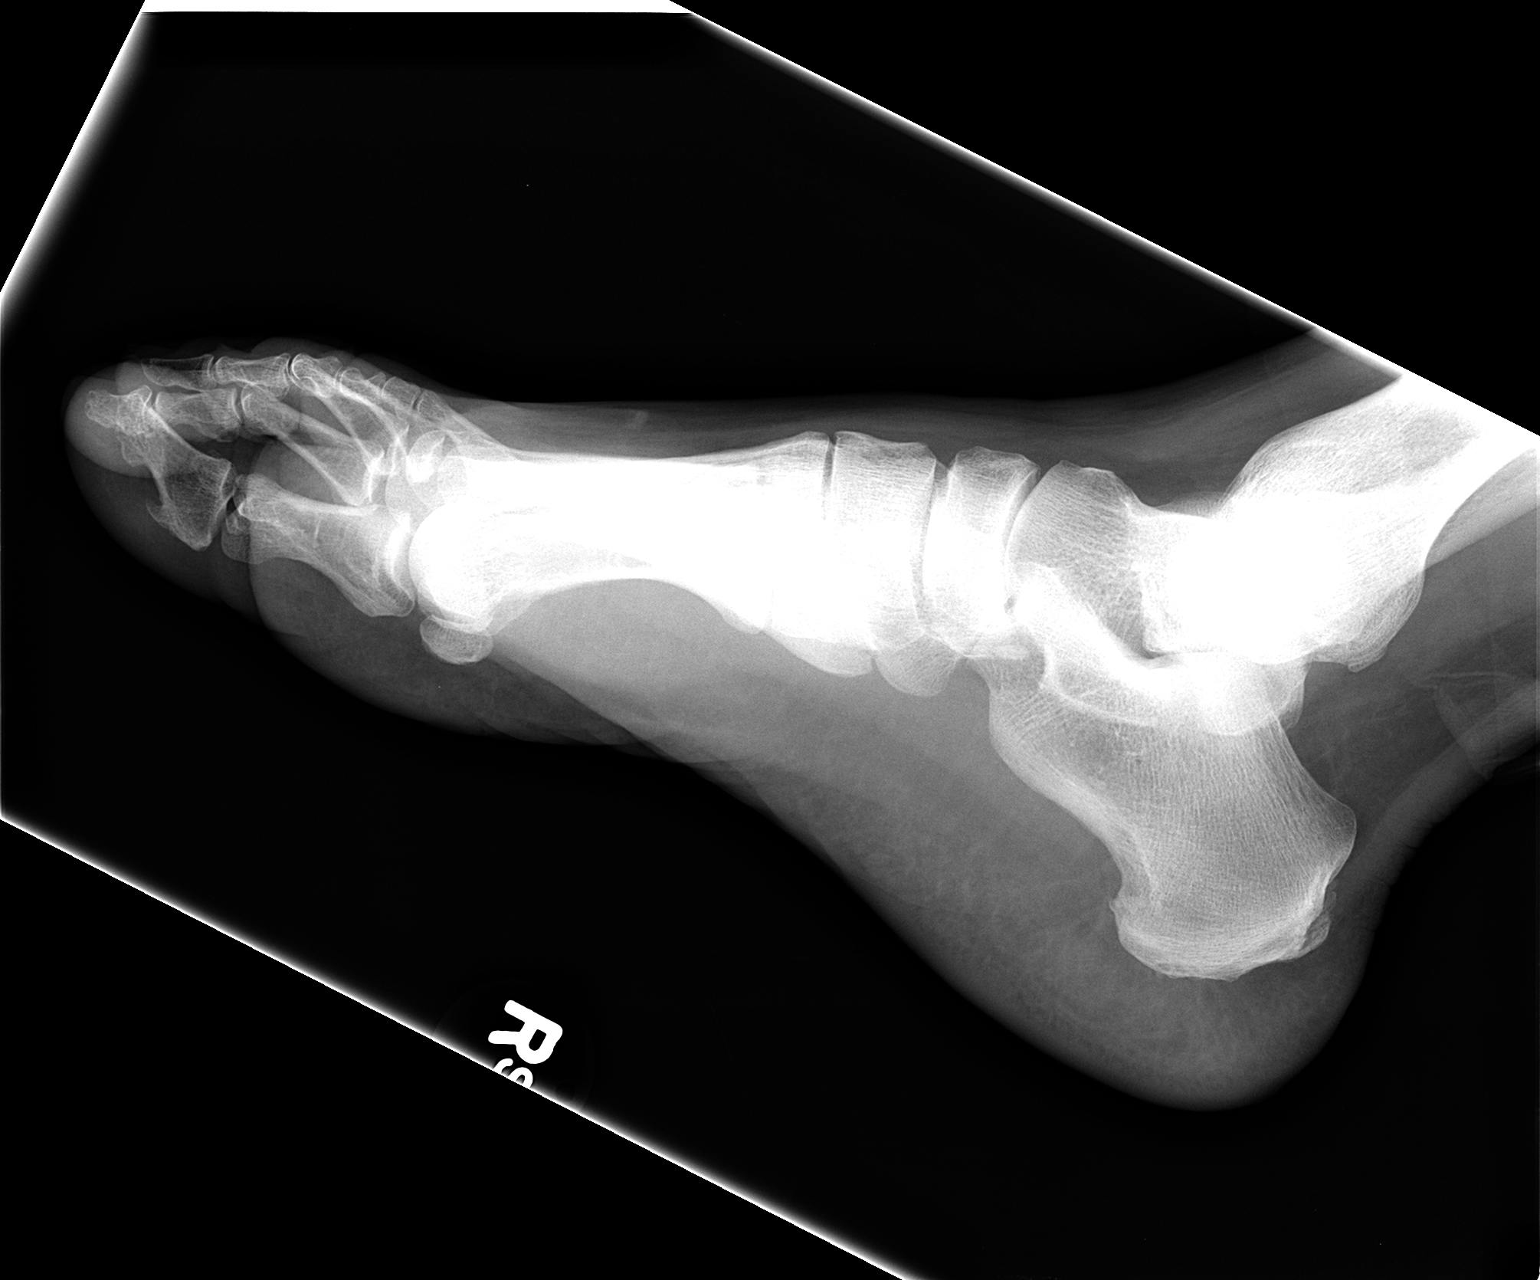

[3 of 3 positions shown; findings below may reference images not displayed]

FINDINGS: No acute bony abnormality.  Specifically, no fracture,
subluxation, or dislocation.  Soft tissues are intact. Joint spaces
are maintained.  Normal bone mineralization.
IMPRESSION: No acute bony abnormality.

## 2012-08-22 NOTE — Progress Notes (Signed)
  Subjective:    Patient ID: Kevin Stuart, male    DOB: 01-17-1951, 61 y.o.   MRN: [id number]  Foot Pain This is a recurrent problem. The current episode started more than 1 year ago. The problem occurs every several days. The problem has been gradually worsening. Associated symptoms include arthralgias and myalgias. Nothing aggravates the symptoms. He has tried nothing for the symptoms. The treatment provided no relief.   #2 wife reports hearing loss he would like for me to evaluate his ears.    #3 weight loss. He's had multiple medications changed which does help the pruritus but he is concerned about weight loss but his had. About 30-40 pounds over the last 2-3 months. We'll evaluate him for diabetes.   Review of Systems  Constitutional: Positive for unexpected weight change.  HENT: Positive for hearing loss.   Musculoskeletal: Positive for myalgias and arthralgias.       Bilateral foot pain.  All other systems reviewed and are negative.      BP 100/61  Pulse 88  Ht 5\' 10"  (1.778 m)  Wt 264 lb (119.75 kg)  BMI 37.88 kg/m2 Objective:   Physical Exam  Vitals reviewed. Constitutional:       Obese WM  HENT:  Head: Normocephalic and atraumatic.  Right Ear: External ear normal.  Left Ear: External ear normal.  Eyes: Pupils are equal, round, and reactive to light.  Musculoskeletal:       Tenderness over the lateral distal metatarsal bones on both feet there is some callus formation on the right foot but no calluses on the left. No tenderness to palpation pulses equal and symmetrical  Neurological: He is alert.  Psychiatric: His behavior is normal. Thought content normal. His mood appears anxious. His affect is not angry and not inappropriate. His speech is rapid and/or pressured. Cognition and memory are impaired. He does not express impulsivity or inappropriate judgment. He does not exhibit a depressed mood.    Results for orders placed in visit on 08/22/12  POCT GLYCOSYLATED  HEMOGLOBIN (HGB A1C)      Component Value Range   Hemoglobin A1C 5.6    GLUCOSE, POCT (MANUAL RESULT ENTRY)      Component Value Range   POC Glucose 101 (*) 70 - 99 mg/dl     Assessment & Plan:  #1 bilateral foot pain that has occurred for the past with a recent exacerbation. Should be noted that no signs of tenderness found today. Will refer to podiatrist. Stressed to him I think something is wrong with his stance or external footwear that he uses we'll also get a x-ray of both feet.  #2 Hearing loss. Ear no both TMs look fine with his wife and him having a hearing loss will refer to audiologist for evaluation . #3 weight loss. A1c and blood sugar essentially stable with a mild elevation of blood sugar. Diabetes does not appear to be the culprit or cause of weight loss or neuropathic foot pain.  Return when necessary

## 2012-08-22 NOTE — Patient Instructions (Signed)
Hearing Loss A hearing loss is sometimes called deafness. Hearing loss may be partial or total. CAUSES Hearing loss may be caused by:  Wax in the ear canal.  Infection of the ear canal.  Infection of the middle ear.  Trauma to the ear or surrounding area.  Fluid in the middle ear.  A hole in the eardrum (perforated eardrum).  Exposure to loud sounds or music.  Problems with the hearing nerve.  Certain medications. Hearing loss without wax, infection, or a history of injury may mean that the nerve is involved. Hearing loss with severe dizziness, nausea and vomiting or ringing in the ear may suggest a hearing nerve irritation or problems in the middle or inner ear. If hearing loss is untreated, there is a greater likelihood for residual or permanent hearing loss. DIAGNOSIS A hearing test (audiometry) assesses hearing loss. The audiometry test needs to be performed by a hearing specialist (audiologist). TREATMENT Treatment for recent onset of hearing loss may include:  Ear wax removal.  Medications that kill germs (antibiotics).  Cortisone medications.  Prompt follow up with the appropriate specialist. Return of hearing depends on the cause of your hearing loss, so proper medical follow-up is important. Some hearing loss may not be reversible, and a caregiver should discuss care and treatment options with you. SEEK MEDICAL CARE IF:   You have a severe headache, dizziness, or changes in vision.  You have new or increased weakness.  You develop repeated vomiting or other serious medical problems.  You have a fever. Document Released: 10/04/2005 Document Revised: 12/27/2011 Document Reviewed: 01/29/2010 [facility] Patient Information 2013 [place], [place]. Diabetic Neuropathy Diabetic neuropathy is a common complication caused by diabetes. Neuropathy is a term that means nerve disease or damage. If your diabetes is uncontrolled and you have high blood glucose (sugar) levels,  over time, this can lead to damage to nerves throughout your body. There are three types of diabetic neuropathy:   Peripheral.  Autonomic.  Focal. PERIPHERAL NEUROPATHY Peripheral neuropathy is the most common form of diabetic neuropathy. It causes damage to the nerves of the feet and legs and eventually the hands and arms.  SYMPTOMS  Peripheral neuropathy occurs slowly over time. The peripheral nerves sense touch, hot and cold, and pain. When these nerves no longer work:   Your feet become numb.  You can no longer feel pressure or pain in your feet.  You may have burning, stabbing or aching pain. This can lead to:  Thick calluses over pressure areas.  Pressure sores.  Ulcers. Ulcers can become infected with germs (bacteria) and can even lead to infection in the bones of the feet. DIAGNOSIS  The diagnosis of diabetic neuropathy is difficult at best. Sensory function testing can be done with:  Light touch using a monofilament.  Vibration with tuning fork.  Sharp sensation with pin prick Other tests that can help diagnose neuropathy are:  Nerve Conduction Velocities (NCV). This checks the transmission of electrical current through a nerve.  Electromyography (EMG). This shows how muscles respond to electrical signals transmitted by nearby nerves.  Quantitative sensory testing, which is used to assess how your nerves respond to vibration and changes in temperature. AUTONOMIC NEUROPATHY The autonomic nervous system controls functions that you do not think about. Examples would be:   Heart beat.  Regulation of body temperature.  Blood pressure.  Urination.  Digestion.  Sweating.  Sexual function. SYMPTOMS  The symptoms of autonomic neuropathy vary depending on which nerves are affected.  There can be problems with digestion such as:  Feeling sick to your stomach (nausea).  Vomiting.  Bloating.  Constipation.  Diarrhea.  Abdominal pain.  Difficulty  with urination may occur because of the inability to sense when your bladder is full. You may have urine leakage (incontinence) or inability to empty your bladder completely (retention).  Palpitations or a feeling of an abnormal heart beat.  Blood pressure drops on arising (orthostatic hypotension). This can happen when you first sit up or stand up. It causes you to feel:  Dizzy.  Weak.  Faint.  Sexual functioning:  In men, inability to attain and maintain an erection.  In women, vaginal dryness and problems with decreased sexual desire and arousal. DIAGNOSIS  Diagnosis is often based on reported symptoms. Tell your medical caregiver if you experience:   Dizziness.  Constipation.  Diarrhea.  Inappropriate urination or inability to urinate.  Inability to get or maintain an erection. Tests that may be done include:  An EKG or Holter Monitor. These are tests that can help show problems with the heart rate or heart rhythm.  X-rays can be used to find if there are problems with your ability to properly empty food from your stomach into the small intestine after eating. FOCAL NEUROPATHY Focal neuropathy affects just one nerve tract and occurs suddenly. However, it usually improves by itself over time. It does not cause long term damage, and treatments are usually needed only until the problem improves. SYMPTOMS  Examples include:   Abnormal eye movements or abnormal alignment of both eyes.  Weakness in the wrist.  Foot drop, which results in inability to lift the foot properly. This causes abnormal walking or foot movement. DIAGNOSIS  Diagnosis is made based on your symptoms and what your caregiver finds on your exam. Other tests that may be done include:  Nerve Conduction Velocities (NCV). This checks the transmission of electrical current through a nerve.  Electromyography (EMG). This shows how muscles respond to electrical signals transmitted by nearby  nerves.  Quantitative sensory testing, which is used to assess how your nerves respond to vibration and changes in temperature. TREATMENT Once nerve damage occurs it cannot be reversed. The goal of treatment is to keep the disease from getting worse. If it gets worse, it will affect more nerve fibers. Controlling your blood (sugar) is the key. You will need to keep your blood glucose and A1c at the target range prescribed by your caregiver. Things that will help control blood glucose levels include:  Blood glucose monitoring.  Meal planning.  Physical activity.  Diabetes medication. Over time, maintaining lower blood glucose levels helps lessen symptoms. Sometimes, prescription pain medicine is needed. Focal neuropathy can be painful and unpredictable and occurs most often in older adults with diabetes.  SEEK MEDICAL CARE IF:   You develop peripheral nerve symptoms such as burning, numbness, or pain in your feet, legs or hands.  You develop autonomic nerve symptoms such as:  Dizziness.  Abnormal urinary control.  Inability to get an erection.  You develop focal nerve symptoms such as sudden abnormal eye movements or sudden foot drop. Document Released: 12/13/2001 Document Revised: 12/27/2011 Document Reviewed: 03/14/2009 [facility] Patient Information 2013 [place], [place].

## 2012-09-11 ENCOUNTER — Ambulatory Visit (INDEPENDENT_AMBULATORY_CARE_PROVIDER_SITE_OTHER): Payer: Medicare Other | Admitting: Family Medicine

## 2012-09-11 ENCOUNTER — Encounter: Payer: Self-pay | Admitting: Family Medicine

## 2012-09-11 VITALS — BP 125/78 | HR 58 | Ht 70.0 in | Wt 259.0 lb

## 2012-09-11 DIAGNOSIS — R234 Changes in skin texture: Secondary | ICD-10-CM

## 2012-09-11 DIAGNOSIS — J3489 Other specified disorders of nose and nasal sinuses: Secondary | ICD-10-CM

## 2012-09-11 DIAGNOSIS — L988 Other specified disorders of the skin and subcutaneous tissue: Secondary | ICD-10-CM

## 2012-09-11 NOTE — Progress Notes (Signed)
CC: Kevin Stuart is a 61 y.o. male is here for lesion on the nose   Subjective: HPI:  2 weeks of right Nose discomfort, described as a sharp pain that is nonradiating. No interventions as of yet, nothing seems to make it better or worse, discomfort present 24 hours a day. Pain is mild in severity. Associated with some on and off again bleeding if he rubs it, but stops within seconds without any particular intervention. Denies fevers, chills, facial pain, nasal discharge, cough.  Review Of Systems Outlined In HPI  Past Medical History  Diagnosis Date  . OCD (obsessive compulsive disorder) 03/23/2012  . Hypertension 03/07/2012  . Generalized pruritus 02/03/2012  . Seborrhea capitis 02/03/2012  . Itching 07/04/2012     No family history on file.   History  Substance Use Topics  . Smoking status: Never Smoker   . Smokeless tobacco: Not on file  . Alcohol Use: Not on file     Objective: Filed Vitals:   09/11/12 1016  BP: 125/78  Pulse: 58    General: Alert and Oriented, No Acute Distress HEENT: Pupils equal, round, reactive to light. Conjunctivae clear.  Moist mucous membranes, pharynx without lesions nor erythema. There is a 2 mm fissure in the right anterior nose is clean without signs of infection. Neck supple without palpable lymphadenopathy nor abnormal masses. Lungs: Comfortable work of breathing Cardiac: Regular rate and rhythm.  Abdomen: Normal bowel sounds, soft and non tender without palpable masses. Skin: Warm and dry. Patient repeatedly rubbing the area of fissure skin during our encounter.  Assessment & Plan: Clintin was seen today for lesion on the nose.  Diagnoses and associated orders for this visit:  Skin fissure  Pain, nose    Discussed with the patient I believe the fissure is from irritation from him rubbing against it with his fingers, skin there looks slightly chapped which probably predisposed him to this. Discussed Vaseline application 3 times a  day, avoiding touching it, if not improved in one week return.  Return if symptoms worsen or fail to improve.

## 2012-09-19 ENCOUNTER — Telehealth: Payer: Self-pay | Admitting: *Deleted

## 2012-09-19 ENCOUNTER — Ambulatory Visit (INDEPENDENT_AMBULATORY_CARE_PROVIDER_SITE_OTHER): Payer: Medicare Other | Admitting: Family Medicine

## 2012-09-19 VITALS — BP 118/71 | HR 56

## 2012-09-19 DIAGNOSIS — I10 Essential (primary) hypertension: Secondary | ICD-10-CM

## 2012-09-19 NOTE — Telephone Encounter (Signed)
Pt notified of Dr. Gardner Candle instructions to cont current BP regimen and keep appt scheduled on Dec 30

## 2012-09-19 NOTE — Progress Notes (Signed)
  Subjective:    Patient ID: Kevin Stuart, male    DOB: 10/28/1950, 61 y.o.   MRN: [id number]  HPI   Pt denies chest pain, SOB, dizziness, or heart palpitations.  Taking meds as directed w/o problems.  Denies medication side effects.  5 min spent with pt.  Review of Systems     Objective:   Physical Exam        Assessment & Plan:

## 2012-09-19 NOTE — Progress Notes (Signed)
Sue Lush, Will you please let Mr. Bodi know that his blood pressure looks great, much better than when I saw him last week.  I'd like him to continue with his current antihypertensive regimen, no need for any adjustments.  I'll see him Dec 30th.

## 2012-10-05 ENCOUNTER — Ambulatory Visit: Payer: Medicare Other | Admitting: Family Medicine

## 2012-10-16 ENCOUNTER — Ambulatory Visit: Payer: Medicare Other | Admitting: Family Medicine

## 2012-10-23 ENCOUNTER — Ambulatory Visit (INDEPENDENT_AMBULATORY_CARE_PROVIDER_SITE_OTHER): Payer: Medicare Other | Admitting: Family Medicine

## 2012-10-23 ENCOUNTER — Encounter: Payer: Self-pay | Admitting: Family Medicine

## 2012-10-23 VITALS — BP 145/86 | HR 52 | Wt 265.0 lb

## 2012-10-23 DIAGNOSIS — I10 Essential (primary) hypertension: Secondary | ICD-10-CM

## 2012-10-23 DIAGNOSIS — E785 Hyperlipidemia, unspecified: Secondary | ICD-10-CM | POA: Insufficient documentation

## 2012-10-23 DIAGNOSIS — L299 Pruritus, unspecified: Secondary | ICD-10-CM

## 2012-10-23 DIAGNOSIS — Z7189 Other specified counseling: Secondary | ICD-10-CM

## 2012-10-23 DIAGNOSIS — Z630 Problems in relationship with spouse or partner: Secondary | ICD-10-CM | POA: Insufficient documentation

## 2012-10-23 MED ORDER — AMLODIPINE BESYLATE 10 MG PO TABS: 5.0000 mg | ORAL_TABLET | Freq: Every day | ORAL | Status: DC

## 2012-10-23 NOTE — Progress Notes (Signed)
CC: Kevin Stuart is a 62 y.o. male is here for Hypertension   Subjective: HPI:  Hypertension: Patient has decided to take himself off of all antihypertensive medications which included amlodipine and Lotensin HCT. He would like to see if he still needs these medications. He has no outside blood pressures to report. Denies headaches, vision changes, motor sensory disturbances, chest pain, shortness of breath, orthopnea, nor peripheral edema.  Hyperlipidemia: One month ago he stopped taking Vytorin in order to see if he still requires this. He is requesting a lipid panel today. There been no dietary or physical activity changes. He denies medication intolerance or side effects.  Itching: For years now he describes an itching that starts predictively at 3 PM everyday and involves his body from the waist up not including his face or scalp. He reports seeing specialists including allergists and dermatologists without a definitive diagnosis and was told to manage this with gabapentin and oral antihistamines which worked for a few years that slowly lost their effectiveness. Nothing else seems to make better or worse. It is moderate in severity. He's tried changing personal care products without benefit. He denies change in skin at the site of the itching.  He is adamant about discussing concerns that the mental status of his wife maybe worsening. He describes her getting lost, trouble driving, and forgetfulness. When he brings this up to her it it causes conflict between the 2 of them.  She usually turns this around and places different blames on him which causes stress between the 2 of them.  He is to discuss this with her PCP Dr. Judie Petit however HIPPA laws have kept her from disclosing information to the patient.  Patient requests that discuss his wife's overall medical conditions and medications and to call him but under no circumstances can I discussed this with his wife.  He has decided to stop taking all  psych medications which it sounds like was his own idea, he does not believe he has OCD but tells me he continues to see his regular PA at his longtime psych clinic    Review Of Systems Outlined In HPI  Past Medical History  Diagnosis Date  . OCD (obsessive compulsive disorder) 03/23/2012  . Hypertension 03/07/2012  . Generalized pruritus 02/03/2012  . Seborrhea capitis 02/03/2012  . Itching 07/04/2012     No family history on file.   History  Substance Use Topics  . Smoking status: Never Smoker   . Smokeless tobacco: Not on file  . Alcohol Use: Not on file     Objective: Filed Vitals:   10/23/12 1455  BP: 145/86  Pulse: 52    General: Alert and Oriented, No Acute Distress HEENT: Pupils equal, round, reactive to light. Conjunctivae clear.  Moist mucous membranes,.  Neck supple without palpable lymphadenopathy nor abnormal masses. Lungs: Clear to auscultation bilaterally, no wheezing/ronchi/rales.  Comfortable work of breathing. Good air movement. Cardiac: Regular rate and rhythm. Normal S1/S2.  No murmurs, rubs, nor gallops.   Abdomen: Normal bowel sounds, soft and non tender without palpable masses. Extremities: No peripheral edema.  Strong peripheral pulses.  Mental Status: No depression, somewhat anxious and animated. Lengthy rambling and tangential thought process. Skin: Warm and dry without abnormalities on the torso and sites that he reports itching  Assessment & Plan: Javen was seen today for hypertension.  Diagnoses and associated orders for this visit:  Hypertension - amLODipine (NORVASC) 10 MG tablet; Take 0.5 tablets (5 mg total) by mouth daily.  Itching - Bile acids, total  Hyperlipidemia - Lipid panel  Marital stress  Other Orders - Discontinue: ezetimibe-simvastatin (VYTORIN) 10-10 MG per tablet; Take 1 tablet by mouth at bedtime.    Hypertension: Uncontrolled, he is agreeable to restarting low dose of amlodipine and rechecking in 4 weeks to  determine if changes are needed. Itching: It sounds like he's never had bile acids checked, this might be the only thing I can offer him is his reported workup appears quite thorough Hyperlipidemia: Checking cholesterol off Vytorin to determine the need to stay on or continued sensation Marital stress: Described it be unable to dig up medical records or information regarding his wife with the intent of only explaing it to him. Kindly encouraged him for he and his wife to have a joint meeting with her PCP to discuss his concerns. He declines marriage counseling referral  25 minutes spent face-to-face during visit today of which at least 50% was counseling or coordinating care regarding hypertension, hyperlipidemia, itching, marital stress management.   Return in about 4 weeks (around 11/20/2012).

## 2012-10-24 LAB — LIPID PANEL
Cholesterol: 230 mg/dL — ABNORMAL HIGH (ref 0–200)
LDL Cholesterol: 159 mg/dL — ABNORMAL HIGH (ref 0–99)
Total CHOL/HDL Ratio: 4.7 Ratio
VLDL: 22 mg/dL (ref 0–40)

## 2012-10-26 LAB — BILE ACIDS, TOTAL: Bile Acids Total: 12 umol/L (ref 0–19)

## 2012-12-04 ENCOUNTER — Ambulatory Visit: Payer: Medicare Other | Admitting: Family Medicine

## 2012-12-11 ENCOUNTER — Telehealth: Payer: Self-pay | Admitting: Family Medicine

## 2012-12-11 DIAGNOSIS — N182 Chronic kidney disease, stage 2 (mild): Secondary | ICD-10-CM | POA: Insufficient documentation

## 2012-12-11 NOTE — Telephone Encounter (Signed)
Patient called request to have a referral to a [facility]. He does not have a preference just as long as they accept his medicare/bcbs insurance. Thanks

## 2012-12-11 NOTE — Telephone Encounter (Signed)
Referral has been placed. 

## 2013-01-08 ENCOUNTER — Other Ambulatory Visit: Payer: Self-pay | Admitting: *Deleted

## 2013-01-08 DIAGNOSIS — I10 Essential (primary) hypertension: Secondary | ICD-10-CM

## 2013-01-08 MED ORDER — AMLODIPINE BESYLATE 10 MG PO TABS: 5.0000 mg | ORAL_TABLET | Freq: Every day | ORAL | Status: DC

## 2013-01-16 IMAGING — CR DG FOOT COMPLETE 3+V*L*
3 series · 3 of 3 positions shown · non-contrast
Comparison: None.

CLINICAL DATA: Foot pain.

LEFT FOOT - COMPLETE 3+ VIEW

[view not recorded (1 of 3)]
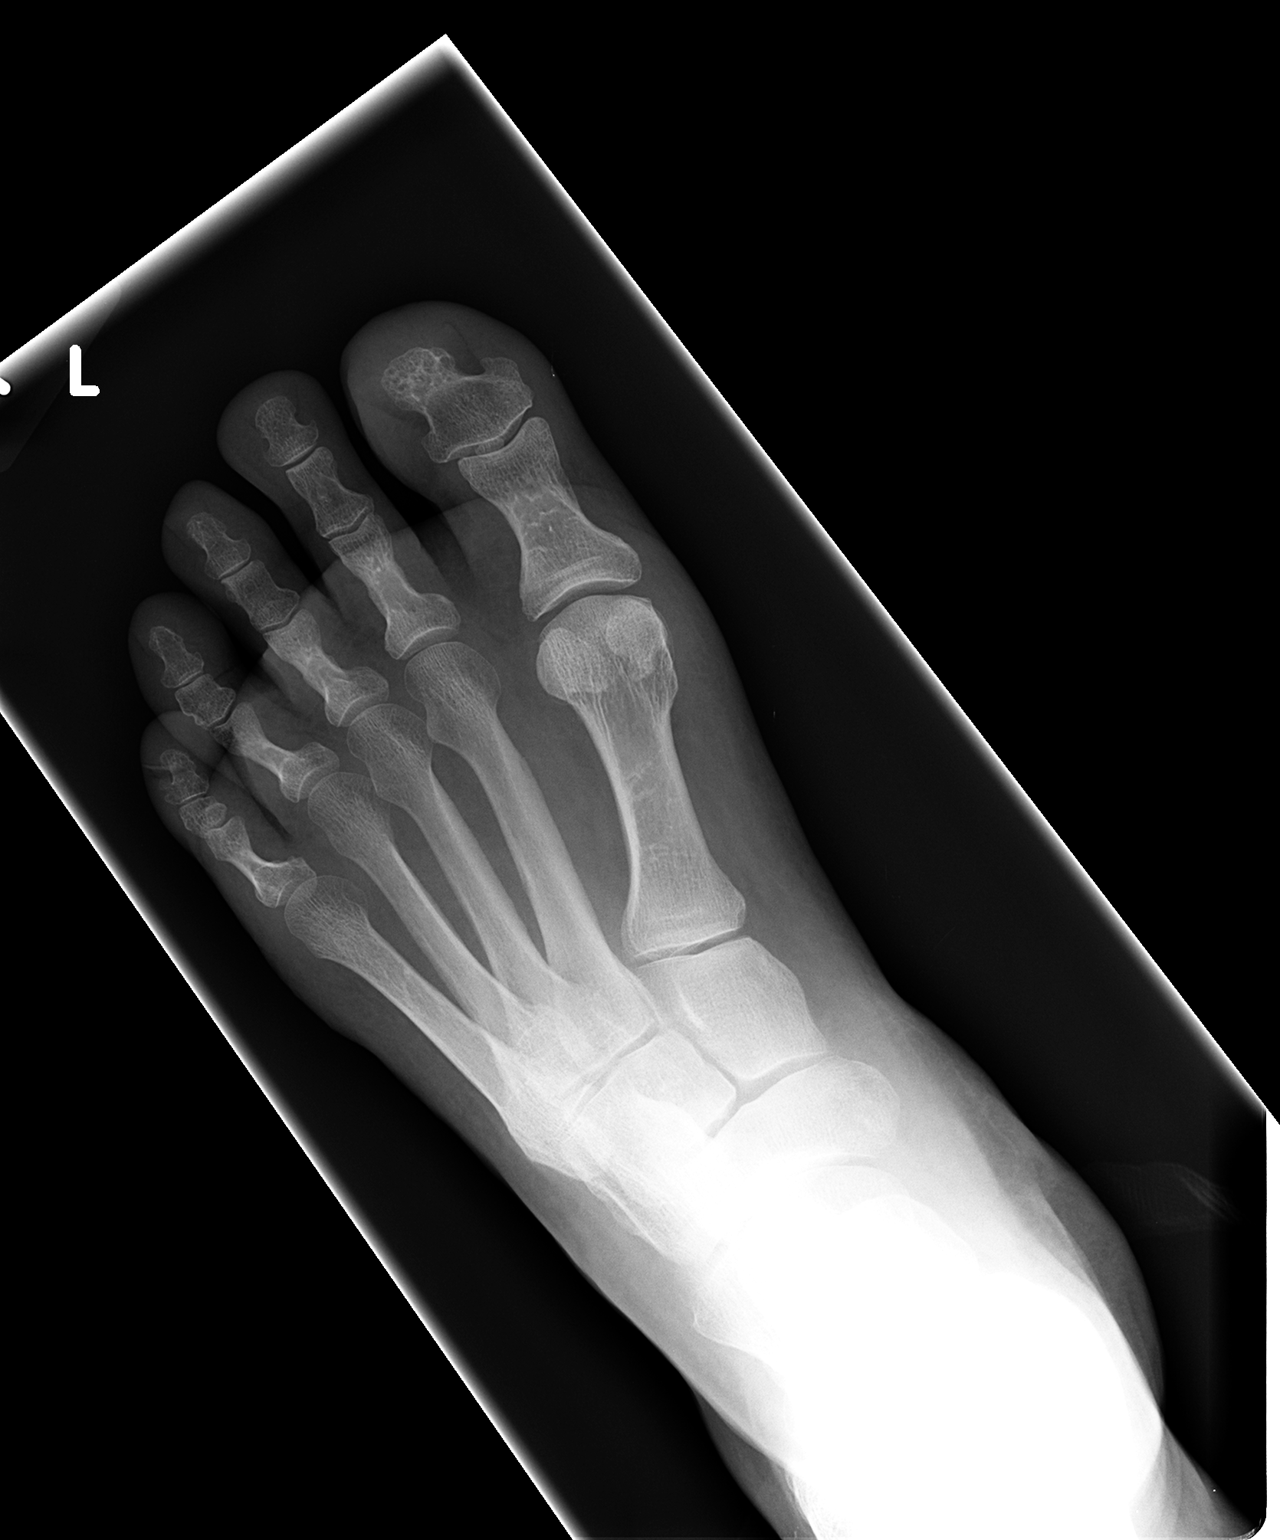

[view not recorded (2 of 3)]
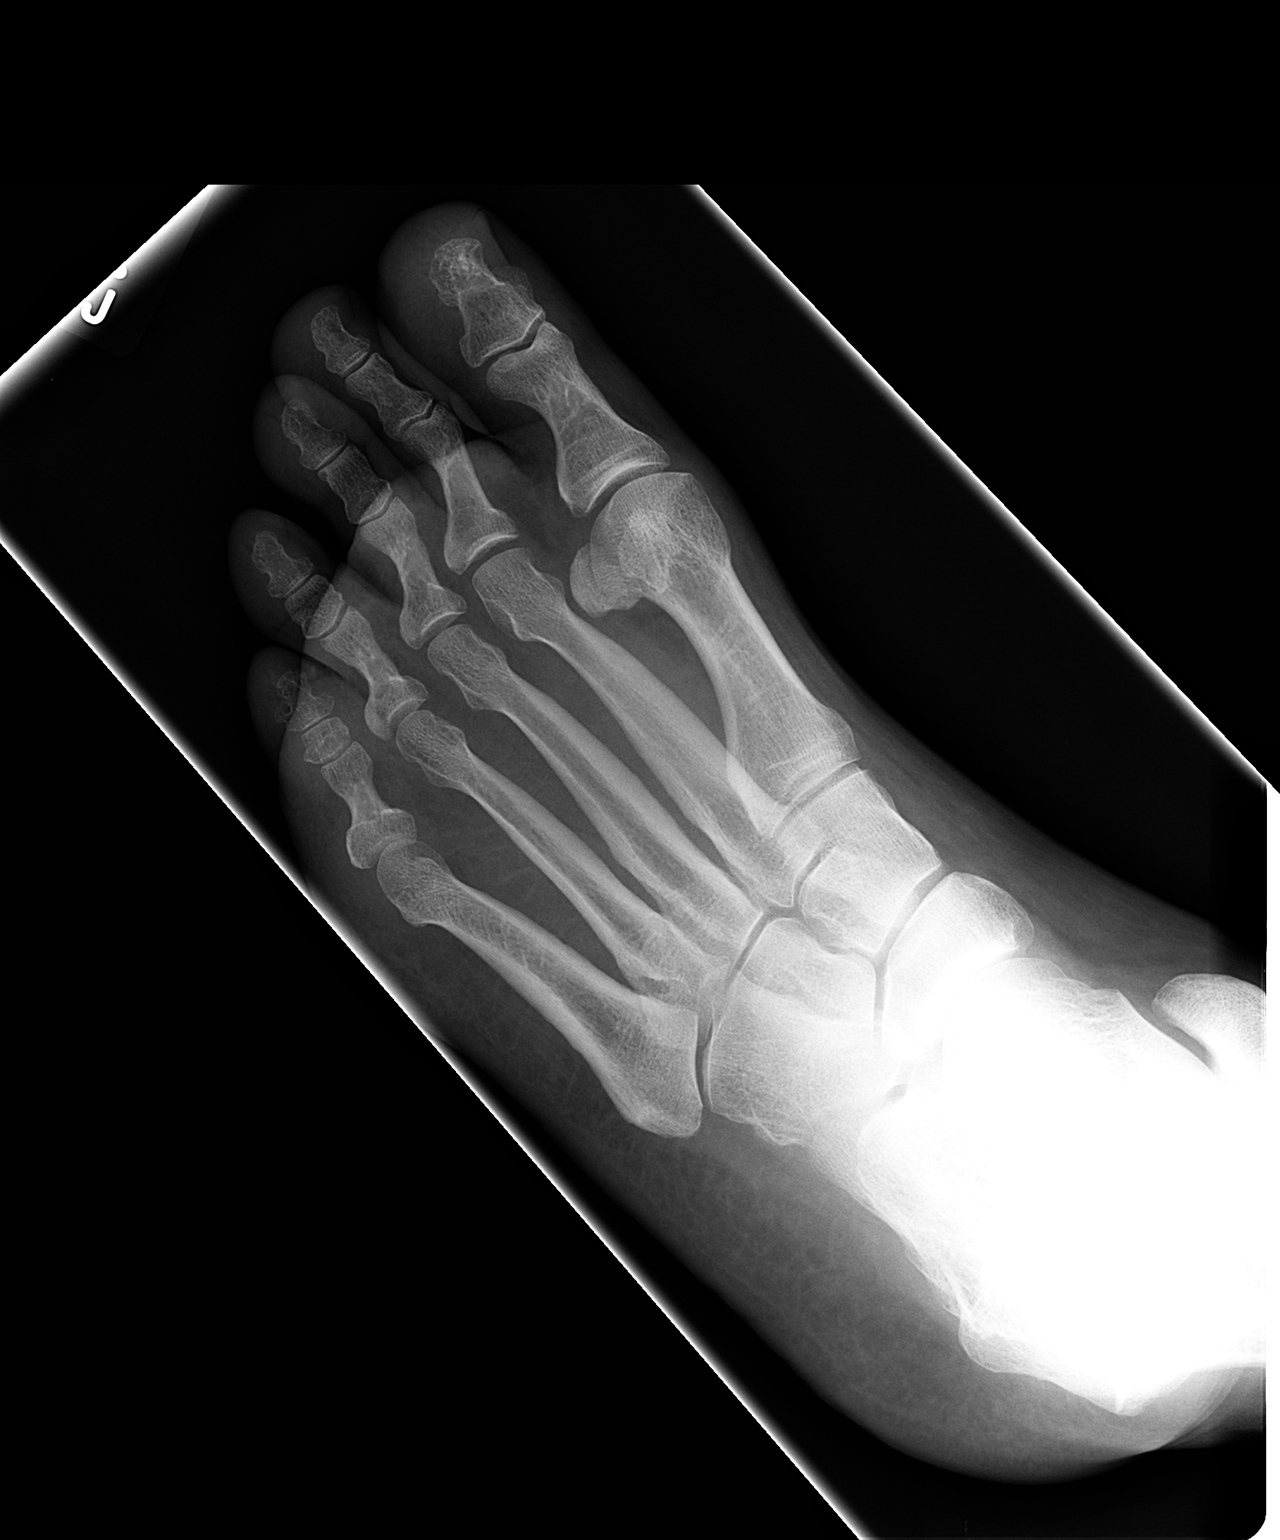

[view not recorded (3 of 3)]
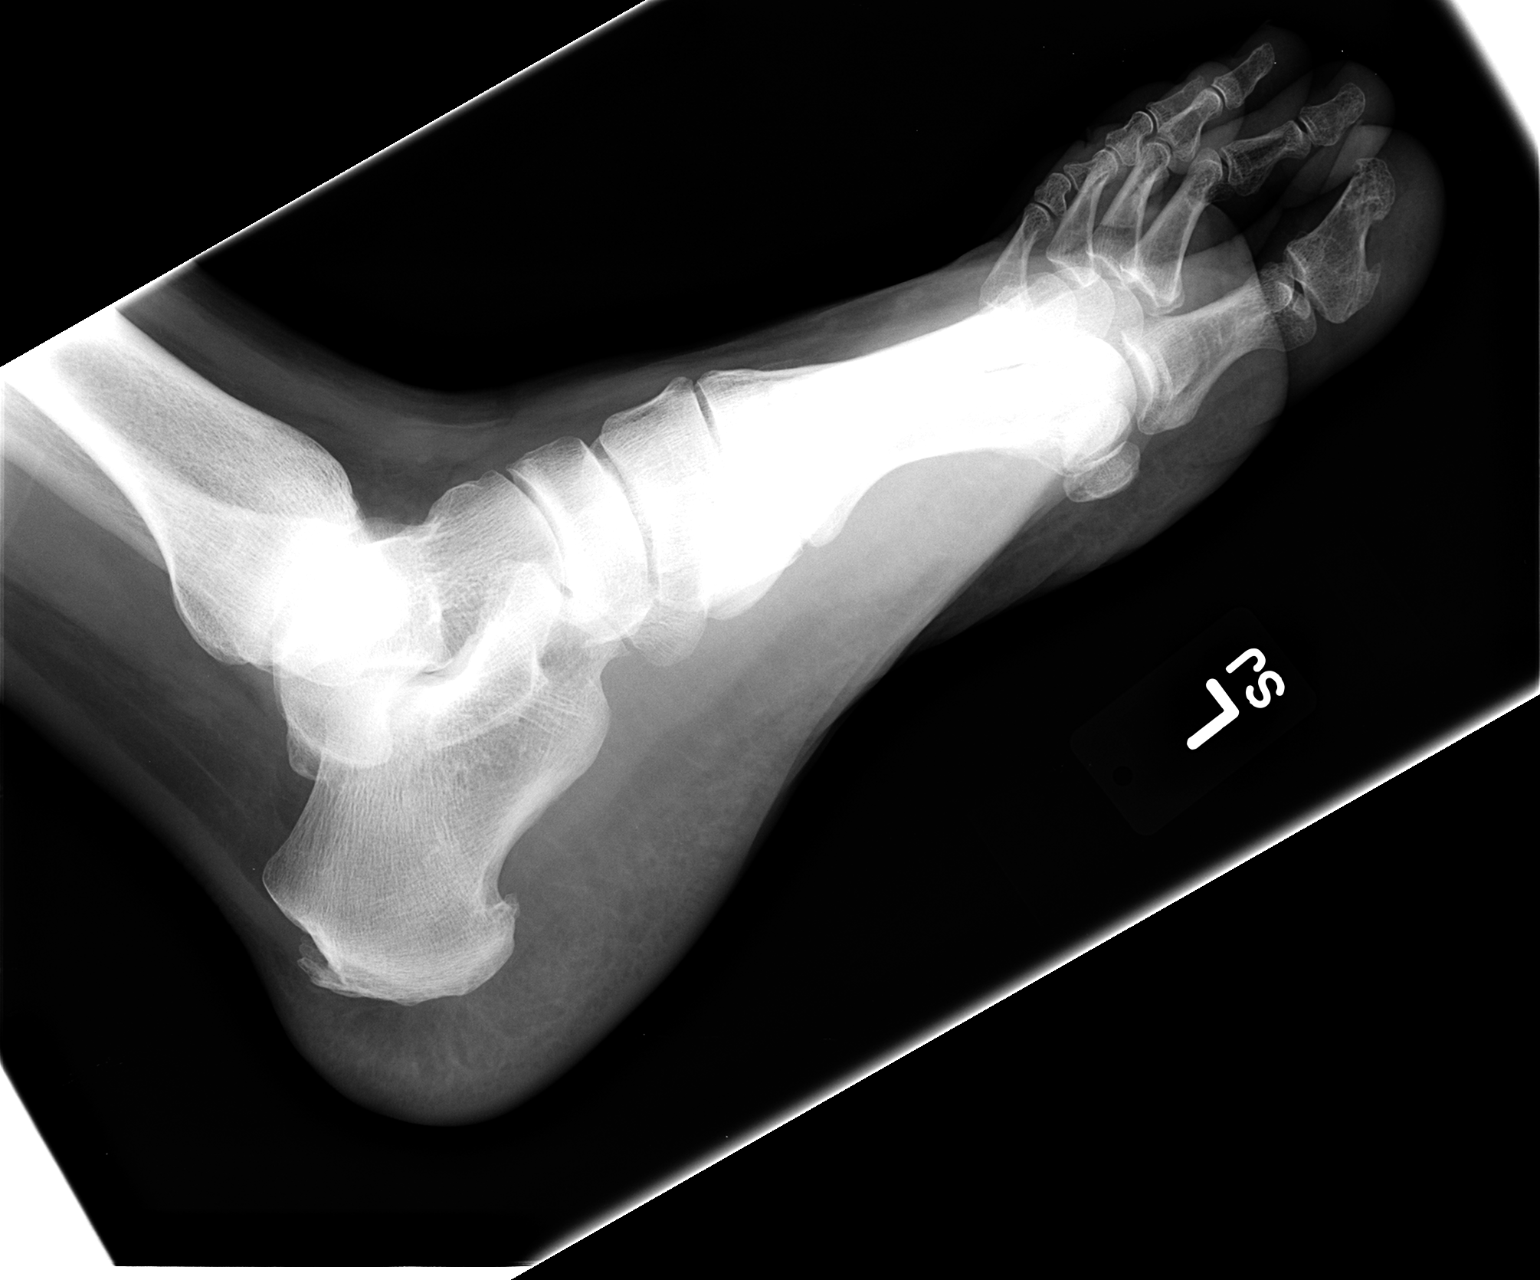

[3 of 3 positions shown; findings below may reference images not displayed]

FINDINGS: Mild pes planus.  Plantar calcaneal spur. No acute bony
abnormality.  Specifically, no fracture, subluxation, or
dislocation.  Soft tissues are intact.
IMPRESSION: No acute bony abnormality.

## 2013-06-15 DIAGNOSIS — R609 Edema, unspecified: Secondary | ICD-10-CM | POA: Insufficient documentation

## 2013-06-15 DIAGNOSIS — E669 Obesity, unspecified: Secondary | ICD-10-CM | POA: Insufficient documentation

## 2014-12-13 ENCOUNTER — Encounter: Payer: Self-pay | Admitting: Family Medicine

## 2014-12-13 DIAGNOSIS — N400 Enlarged prostate without lower urinary tract symptoms: Secondary | ICD-10-CM | POA: Insufficient documentation

## 2020-10-30 ENCOUNTER — Other Ambulatory Visit: Payer: Self-pay

## 2020-10-30 ENCOUNTER — Ambulatory Visit (INDEPENDENT_AMBULATORY_CARE_PROVIDER_SITE_OTHER): Payer: Medicare Other | Admitting: Medical-Surgical

## 2020-10-30 ENCOUNTER — Encounter: Payer: Self-pay | Admitting: Medical-Surgical

## 2020-10-30 VITALS — BP 157/69 | HR 48 | Temp 98.6°F | Ht 68.5 in | Wt 298.3 lb

## 2020-10-30 DIAGNOSIS — Z7689 Persons encountering health services in other specified circumstances: Secondary | ICD-10-CM

## 2020-10-30 DIAGNOSIS — R202 Paresthesia of skin: Secondary | ICD-10-CM

## 2020-10-30 DIAGNOSIS — G959 Disease of spinal cord, unspecified: Secondary | ICD-10-CM

## 2020-10-30 DIAGNOSIS — M5030 Other cervical disc degeneration, unspecified cervical region: Secondary | ICD-10-CM

## 2020-10-30 DIAGNOSIS — R29898 Other symptoms and signs involving the musculoskeletal system: Secondary | ICD-10-CM | POA: Diagnosis not present

## 2020-10-30 DIAGNOSIS — R200 Anesthesia of skin: Secondary | ICD-10-CM | POA: Diagnosis not present

## 2020-10-30 MED ORDER — IBUPROFEN 800 MG PO TABS: 800.0000 mg | ORAL_TABLET | Freq: Three times a day (TID) | ORAL | 1 refills | Status: DC | PRN

## 2020-10-30 NOTE — Progress Notes (Signed)
New Patient Office Visit  Subjective:  Patient ID: Kevin Stuart, male    DOB: Mar 12, 1951  Age: 70 y.o. MRN: [id number]  CC:  Chief Complaint  Patient presents with  . Establish Care  . Follow-up    Kevin Stuart 10/22/2020  . Arm Pain    Left arm pain and numbness     HPI Kevin Stuart presents to establish care. Previous patient of this practice but switched to a [facility] provider for several years. Reports he is changing back since this is closer to his home and more convenient. Recent ED visit on 10/22/20 for left facial/arm numbness. Was evaluated for stroke and MI, negative for both. His imaging found significant cervical DDD with foraminal stenosis at C5-6 and C6-7. Suspected cause of symptoms is nerve impingement/cervical radiculopathy. He was given a prescription for Ibuprofen 800mg  every 8 hours and sent home with instructions to follow up with his PCP. Today, he notes that his left arm numbness has continued with no improvement or worsening. His left arm is acutely weak and has also not worsened or improved. He is unable to perform normal activities with his left arm/hand. The numbness is worse on the forearm and hand, mild on the upper arm. Denies neck pain or stiffness. No alteration in strength or sensation of the right arm or the lower extremities. Notes that he does have some mild numbness along the left side of his face at the lateral cheek/temple area. His left eye is rapidly blinking/twiching. No vision changes.   Past Medical History:  Diagnosis Date  . Generalized pruritus 02/03/2012  . Hyperlipidemia   . Hypertension 03/07/2012  . Itching 07/04/2012  . OCD (obsessive compulsive disorder) 03/23/2012  . Overactive bladder   . Seborrhea capitis 02/03/2012    Past Surgical History:  Procedure Laterality Date  . GASTRIC BYPASS    . VASECTOMY      Family History  Problem Relation Age of Onset  . Breast cancer Mother     Social History   Socioeconomic History   . Marital status: Married    Spouse name: Not on file  . Number of children: Not on file  . Years of education: Not on file  . Highest education level: Not on file  Occupational History  . Not on file  Tobacco Use  . Smoking status: Never Smoker  . Smokeless tobacco: Never Used  Vaping Use  . Vaping Use: Never used  Substance and Sexual Activity  . Alcohol use: Yes    Comment: Rarely  . Drug use: Never  . Sexual activity: Not Currently    Partners: Female    Birth control/protection: Abstinence  Other Topics Concern  . Not on file  Social History Narrative  . Not on file   Social Determinants of Health   Financial Resource Strain: Not on file  Food Insecurity: Not on file  Transportation Needs: Not on file  Physical Activity: Not on file  Stress: Not on file  Social Connections: Not on file  Intimate Partner Violence: Not on file    ROS Review of Systems  Constitutional: Negative for chills and fever.  Respiratory: Negative for cough and shortness of breath.   Cardiovascular: Negative for chest pain, palpitations and leg swelling.  Musculoskeletal: Negative for neck pain and neck stiffness.  Neurological: Positive for weakness and numbness.    Objective:   Today's Vitals: BP (!) 157/69   Pulse (!) 48   Temp 98.6 F (37 C)  Ht 5' 8.5" (1.74 m)   Wt 298 lb 4.8 oz (135.3 kg)   SpO2 98%   BMI 44.70 kg/m   Physical Exam Vitals and nursing note reviewed.  Constitutional:      General: He is not in acute distress.    Appearance: Normal appearance. He is obese.  HENT:     Head: Normocephalic and atraumatic.  Eyes:     General: Vision grossly intact.     Comments: Rapid blinking of the left eye  Cardiovascular:     Rate and Rhythm: Normal rate and regular rhythm.     Pulses: Normal pulses.     Heart sounds: Normal heart sounds. No murmur heard. No friction rub. No gallop.   Pulmonary:     Effort: Pulmonary effort is normal. No respiratory distress.      Breath sounds: Normal breath sounds.  Skin:    General: Skin is warm and dry.  Neurological:     Mental Status: He is alert and oriented to person, place, and time.     Sensory: Sensory deficit (left upper extremity) present.     Motor: Weakness (left hand grip) present.  Psychiatric:        Mood and Affect: Mood normal.        Behavior: Behavior normal.        Thought Content: Thought content normal.        Judgment: Judgment normal.     Assessment & Plan:   1. Encounter to establish care Reviewed available records and discussed care concerns with patient. Of biggest concern right now is the numbness/weakness of the left arm. BP is a bit elevated today as well. Unclear etiology of rapid blinking.  2. Weakness/numbness of left arm 3. Disease of spinal cord (HCC)/Cervical DDD Weakness of the arm/hand concerning. Ordering MRI of the cervical spine w/o contrast for further evaluation. Continue Ibuprofen 800mg  every 8 hours as needed.  - MR Cervical Spine Wo Contrast; Future  Outpatient Encounter Medications as of 10/30/2020  Medication Sig  . amLODipine (NORVASC) 10 MG tablet Take 0.5 tablets (5 mg total) by mouth daily.  Marland Kitchen aspirin 81 MG tablet Take 81 mg by mouth daily.  . pravastatin (PRAVACHOL) 40 MG tablet Take 40 mg by mouth daily.  . [DISCONTINUED] ibuprofen (ADVIL) 800 MG tablet Take 800 mg by mouth every 8 (eight) hours as needed.  Marland Kitchen GEMTESA 75 MG TABS Take 1 tablet by mouth daily. (Patient not taking: Reported on 10/30/2020)  . ibuprofen (ADVIL) 800 MG tablet Take 1 tablet (800 mg total) by mouth every 8 (eight) hours as needed.  . [DISCONTINUED] ascorbic Acid (VITAMIN C) 500 MG CPCR Take 500 mg by mouth daily.  . [DISCONTINUED] MULTIPLE VITAMIN PO Take by mouth.   No facility-administered encounter medications on file as of 10/30/2020.    Follow-up: Return if symptoms worsen or fail to improve.   Kevin Sorrel, DNP, APRN, FNP-BC Deputy Primary Care and Sports Medicine

## 2020-11-01 ENCOUNTER — Ambulatory Visit (INDEPENDENT_AMBULATORY_CARE_PROVIDER_SITE_OTHER): Payer: Medicare Other

## 2020-11-01 ENCOUNTER — Other Ambulatory Visit: Payer: Self-pay

## 2020-11-01 DIAGNOSIS — M4802 Spinal stenosis, cervical region: Secondary | ICD-10-CM

## 2020-11-01 DIAGNOSIS — M5021 Other cervical disc displacement,  high cervical region: Secondary | ICD-10-CM | POA: Diagnosis not present

## 2020-11-01 DIAGNOSIS — G959 Disease of spinal cord, unspecified: Secondary | ICD-10-CM

## 2020-11-01 IMAGING — MR MR CERVICAL SPINE W/O CM
5 series · 40 of 48 positions shown · non-contrast
Comparison: None.

CLINICAL DATA: Left arm numbness and weakness for 10 days. No known
injury.

EXAM:
MRI CERVICAL SPINE WITHOUT CONTRAST
TECHNIQUE: Multiplanar, multisequence MR imaging of the cervical spine was
performed. No intravenous contrast was administered.

[Series 2: T2 · sagittal · 3.0mm · 0.69mm/px · 6 of 13 slices shown (1 of 2)]
[im 1/13]
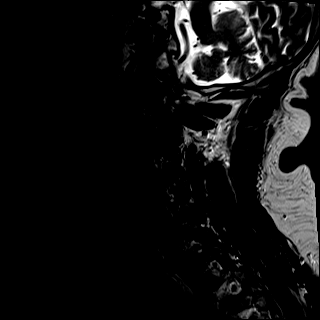
[im 3/13]
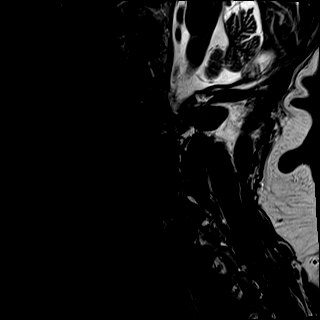
[im 5/13]
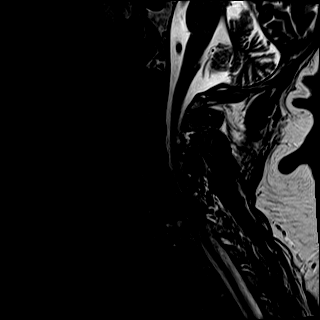
[im 8/13]
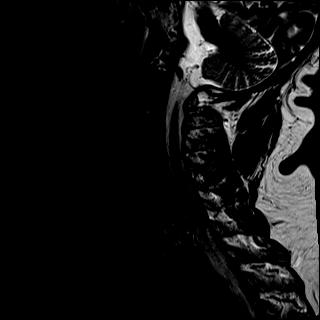
[im 10/13]
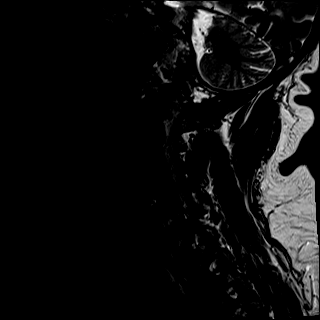
[im 13/13]
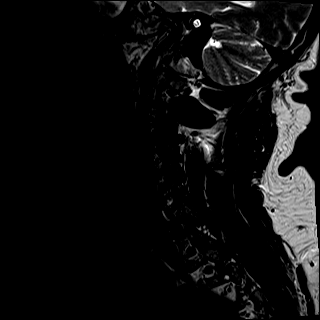

[Series 3: T1 · sagittal · 3.0mm · 0.86mm/px · 6 of 13 slices shown]
[im 1/13]
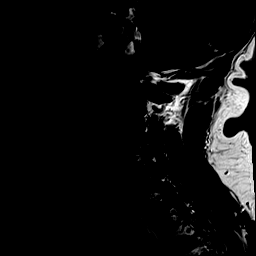
[im 3/13]
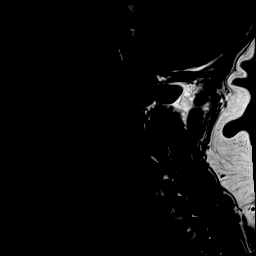
[im 5/13]
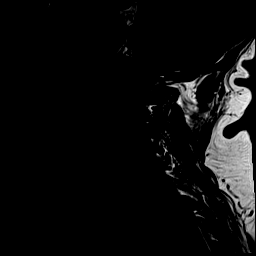
[im 8/13]
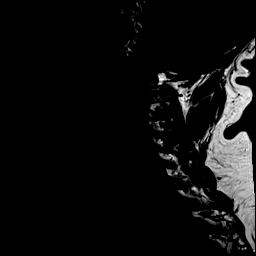
[im 10/13]
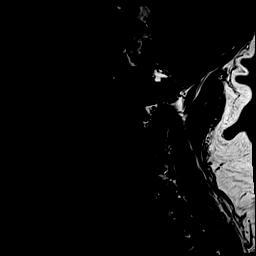
[im 13/13]
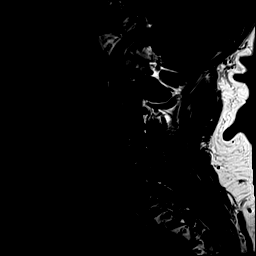

[Series 4: STIR · sagittal · 3.0mm · 0.69mm/px · 6 of 13 slices shown]
[im 1/13]
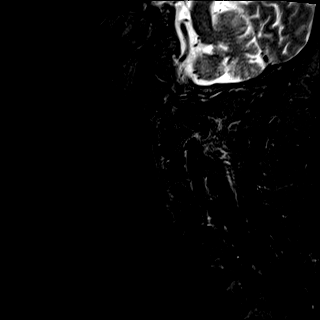
[im 3/13]
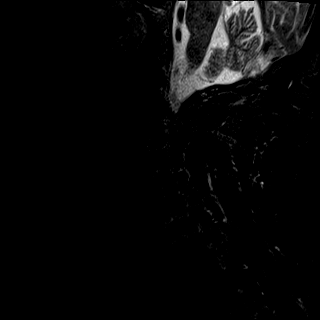
[im 5/13]
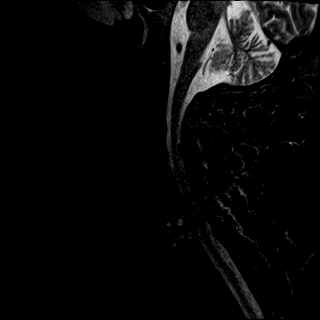
[im 8/13]
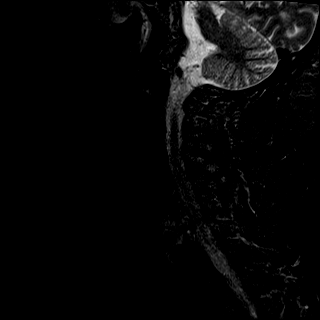
[im 10/13]
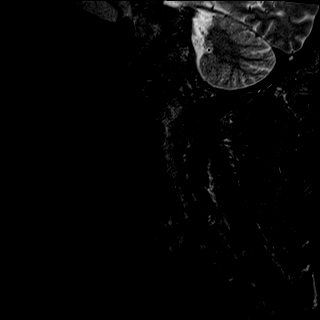
[im 13/13]
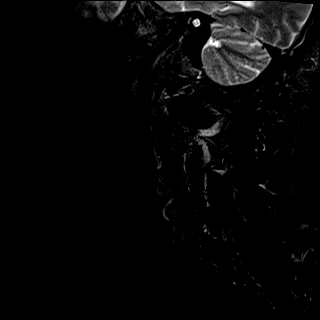

[Series 5: T2 · axial · 3.0mm · 0.62mm/px · z∈[-146,-32]mm · 14 of 32 slices shown (2 of 2)]
[im 1/32]
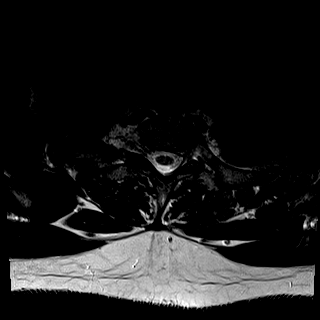
[im 3/32]
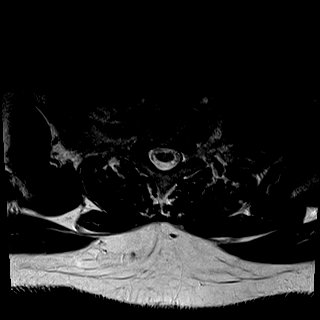
[im 5/32]
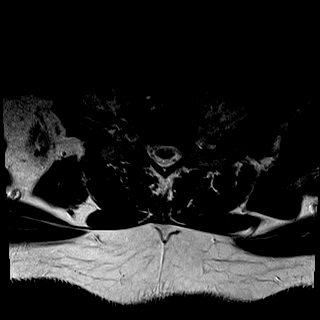
[im 7/32]
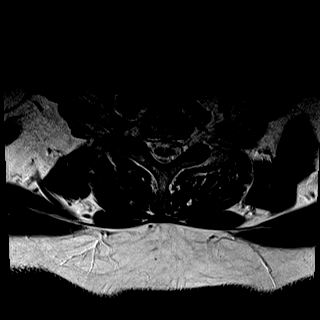
[im 9/32]
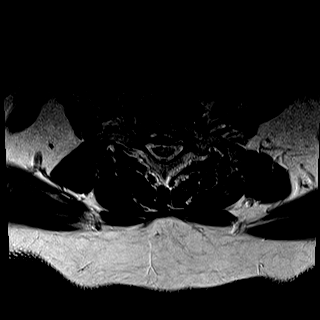
[im 12/32]
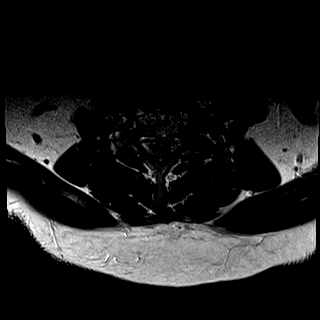
[im 14/32]
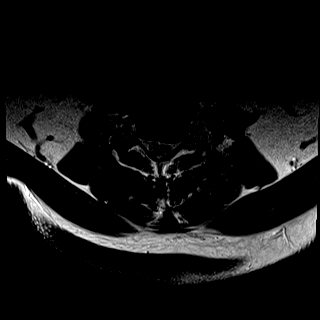
[im 16/32]
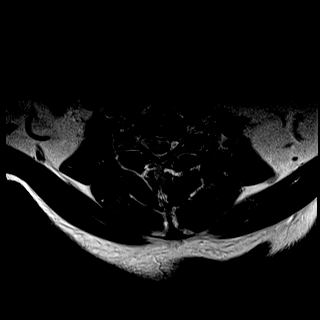
[im 18/32]
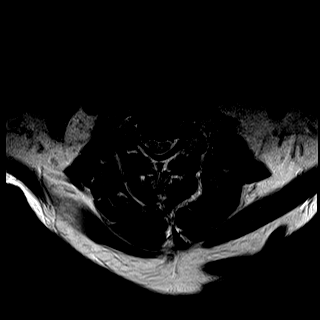
[im 20/32]
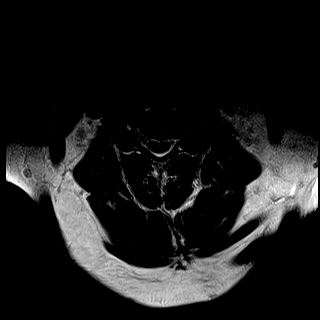
[im 23/32]
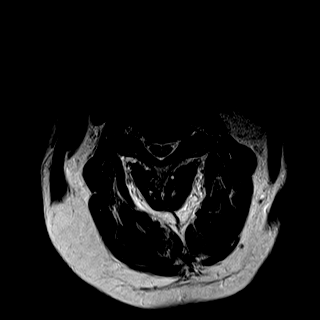
[im 25/32]
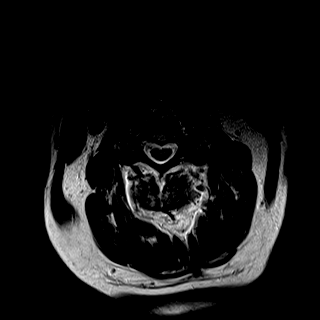
[im 27/32]
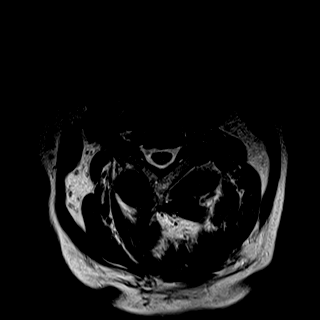
[im 32/32]
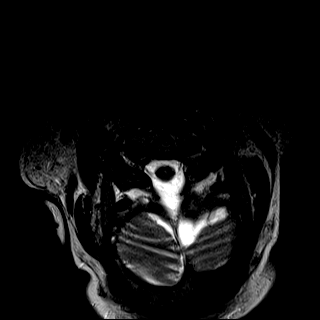

[Series 6: mpgr ax · axial · 3.0mm · 0.35mm/px · z∈[-135,-21]mm · 8 of 32 slices shown]
[im 1/32]
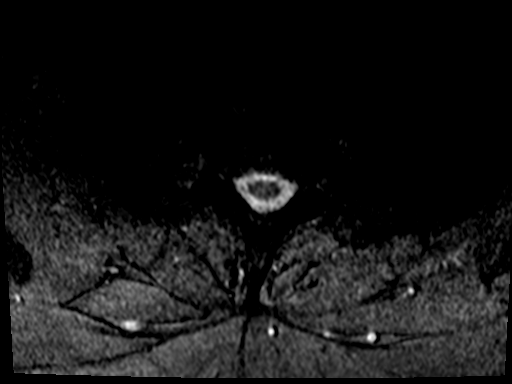
[im 5/32]
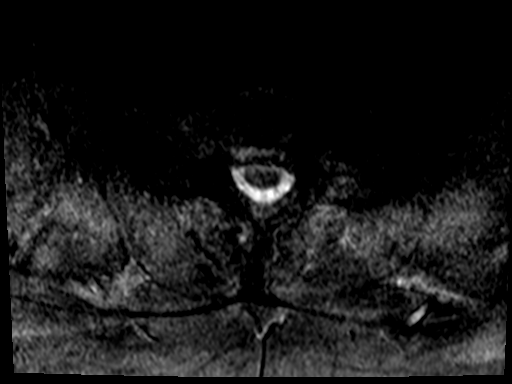
[im 9/32]
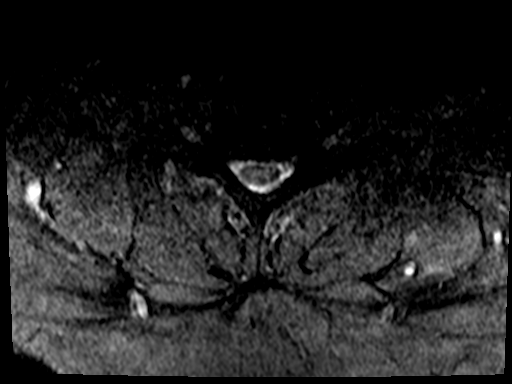
[im 14/32]
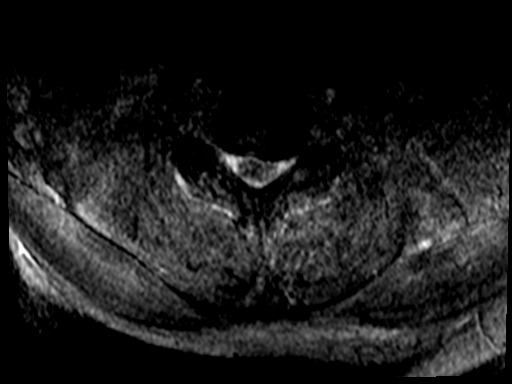
[im 18/32]
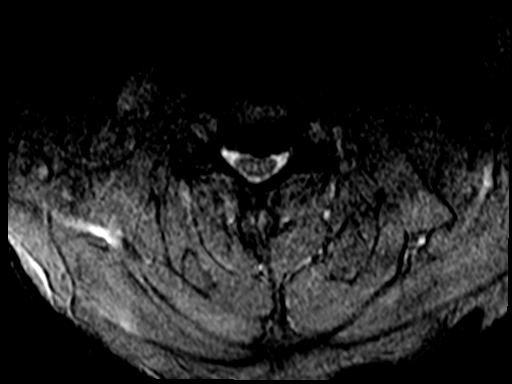
[im 23/32]
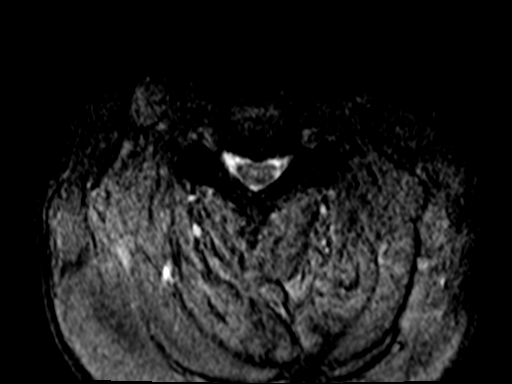
[im 27/32]
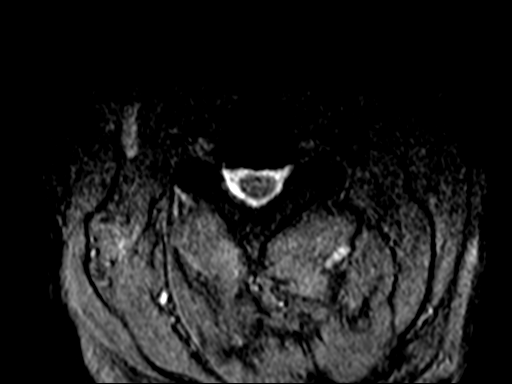
[im 32/32]
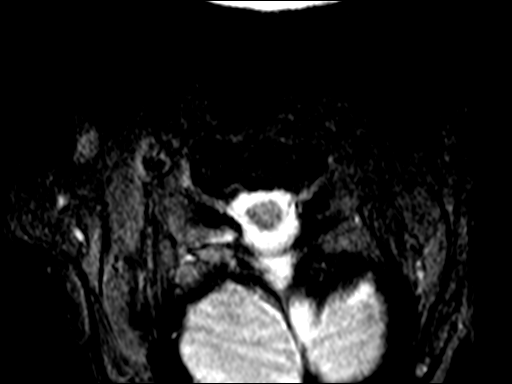

[40 of 48 positions shown; findings below may reference images not displayed]

FINDINGS: Alignment: Normal.

Vertebrae: No fracture, evidence of discitis, or bone lesion.
Degenerative endplate signal change is seen at C6-7.

Cord: Normal signal throughout.

Posterior Fossa, vertebral arteries, paraspinal tissues: Negative.

Disc levels:

C2-3: Moderate bilateral facet degenerative change. There is
posterior bony ridging and uncovertebral disease. The central canal
is widely patent. Moderate bilateral foraminal narrowing.

C3-4: Mild-to-moderate facet degenerative disease is worse on the
left. There is a shallow central protrusion and uncovertebral
disease. Mild central canal narrowing is present. Mild to moderate
foraminal narrowing is worse on the left.

C4-5: Central disc protrusion mildly deforms the ventral cord.
Uncovertebral disease and left worse than right facet arthropathy
cause moderate to severe foraminal narrowing, worse on the left.

C5-6: There is mild posterior bony ridging. Bilateral uncovertebral
disease. The ventral thecal sac is narrowed but not effaced.
Moderately severe to severe foraminal narrowing is worse on the
left.

C6-7: Disc osteophyte complex and uncovertebral disease. The ventral
cord scratch there is mild flattening of the ventral cord. Severe
bilateral foraminal narrowing appears worse on the right.

C7-T1: Minimal disc bulge without stenosis.
IMPRESSION: Central protrusion at C4-5 mildly deforms the ventral cord. Moderate
to severe foraminal narrowing at C4-5 appears worse on the left.

The ventral thecal sac is effaced at C5-6 where moderately severe to
severe foraminal narrowing is worse on the left.

Mild flattening of the ventral cord at C6-7 where there is severe
bilateral foraminal narrowing

## 2020-11-02 ENCOUNTER — Other Ambulatory Visit: Payer: Medicare Other

## 2020-11-03 ENCOUNTER — Other Ambulatory Visit: Payer: Self-pay | Admitting: Medical-Surgical

## 2020-11-03 ENCOUNTER — Encounter: Payer: Self-pay | Admitting: Medical-Surgical

## 2020-11-03 DIAGNOSIS — R202 Paresthesia of skin: Secondary | ICD-10-CM

## 2020-11-03 DIAGNOSIS — R200 Anesthesia of skin: Secondary | ICD-10-CM

## 2020-11-03 DIAGNOSIS — G959 Disease of spinal cord, unspecified: Secondary | ICD-10-CM

## 2020-11-03 DIAGNOSIS — M5030 Other cervical disc degeneration, unspecified cervical region: Secondary | ICD-10-CM

## 2020-11-03 DIAGNOSIS — R29898 Other symptoms and signs involving the musculoskeletal system: Secondary | ICD-10-CM

## 2020-11-08 ENCOUNTER — Encounter: Payer: Self-pay | Admitting: Medical-Surgical

## 2020-11-10 ENCOUNTER — Encounter: Payer: Self-pay | Admitting: Physician Assistant

## 2020-11-10 ENCOUNTER — Other Ambulatory Visit: Payer: Self-pay

## 2020-11-10 ENCOUNTER — Ambulatory Visit (INDEPENDENT_AMBULATORY_CARE_PROVIDER_SITE_OTHER): Payer: Medicare Other | Admitting: Physician Assistant

## 2020-11-10 VITALS — BP 164/87 | HR 75 | Ht 68.5 in | Wt 295.0 lb

## 2020-11-10 DIAGNOSIS — R152 Fecal urgency: Secondary | ICD-10-CM

## 2020-11-10 DIAGNOSIS — N3281 Overactive bladder: Secondary | ICD-10-CM | POA: Diagnosis not present

## 2020-11-10 DIAGNOSIS — R159 Full incontinence of feces: Secondary | ICD-10-CM | POA: Diagnosis not present

## 2020-11-10 NOTE — Patient Instructions (Addendum)
Start metamucil.   Fecal Incontinence Fecal incontinence, also called accidental bowel leakage, is not being able to control your bowels. This condition happens because the nerves or muscles around the anus do not work the way they should. This affects their ability to hold stool (feces). What are the causes? This condition may be caused by:  Damage to the muscles at the end of the rectum (sphincter).  Damage to the nerves that control bowel movements.  Diarrhea.  Chronic constipation.  Pelvic floor dysfunction. This means the muscles in the pelvis do not work well.  Loss of bowel storage capacity. This occurs when the rectum can no longer stretch in size in order to store feces.  Inflammatory bowel disease (IBD), such as Crohn's disease.  Irritable bowel syndrome (IBS). What increases the risk? You are more likely to develop this condition if you:  Were born with bowels or a pelvis that did not form correctly.  Have had rectal surgery.  Have had radiation treatment for certain cancers.  Have been pregnant, had a vaginal delivery, or had surgery that damaged the pelvic floor muscles.  Had a complicated childbirth, spinal cord injury, or other trauma that caused nerve damage.  Have a condition that can affect nerve function, such as diabetes, Parkinson's disease, or multiple sclerosis.  Have a condition where the rectum drops down into the anus or vagina (prolapse).  Are 44 years of age or older. What are the signs or symptoms? The main symptom of this condition is not being able to control your bowels. You also might not be able to get to the bathroom before a bowel movement. How is this diagnosed? This condition is diagnosed with a medical history and physical exam. You may also have other tests, including:  Blood tests.  Urine tests.  A rectal exam.  Ultrasound.  MRI.  Colonoscopy. This is an exam that looks at your large intestine (colon).  Anal manometry.  This is a test that measures the strength of the anal sphincter.  Anal electromyogram (EMG). This is a test that uses small electrodes to check for nerve damage. How is this treated? Treatment for this condition depends on the cause and severity. Treatment may also focus on addressing any underlying causes of this condition. Treatment may include:  Medicines. This may include medicines to: ? Prevent diarrhea. ? Help with constipation (bulk-forming laxatives). ? Treat any underlying conditions.  Biofeedback therapy. This can help to retrain muscles that are affected.  Fiber supplements. These can help manage your bowel movements.  Nerve stimulation.  Injectable gel to promote tissue growth and better muscle control.  Surgery. You may need: ? Sphincter repair surgery. ? Diversion surgery. This procedure lets feces pass out of your body through a hole in your abdomen. Follow these instructions at home: Eating and drinking  Follow instructions from your health care provider about any eating or drinking restrictions. ? Work with a dietitian to come up with a healthy diet that will help you avoid the foods that can make your condition worse. ? Keep a diet diary to find out which foods or drinks could be making your condition worse.  Drink enough fluid to keep your urine pale yellow.   Lifestyle  Do not use any products that contain nicotine or tobacco, such as cigarettes and e-cigarettes. If you need help quitting, ask your health care provider. This may help your condition.  If you are overweight, talk with your health care provider about how to safely  lose weight. This may help your condition.  Increase your physical activity as told by your health care provider. This may help your condition. Always talk with your health care provider before starting a new exercise program.  Carry a change of clothes and supplies to clean up quickly if you have an episode of fetal  incontinence.  Consider joining a fecal incontinence support group. You can find a support group online or in your local community. General instructions  Take over-the-counter and prescription medicines only as told by your health care provider. This includes any supplements.  Apply a moisture barrier, such as petroleum jelly, to your rectum. This protects the skin from irritation caused by ongoing leaking or diarrhea.  Tell your health care provider if you are upset or depressed about your condition.  Keep all follow-up visits as told by your health care provider. This is important.   Where to find more information  International Foundation for Functional Gastrointestinal Disorders: iffgd.[place] [employer] [facility]: patients.gi.org Contact a health care provider if:  You have a fever.  You have redness, swelling, or pain around your rectum.  Your pain is getting worse or you lose feeling in your rectal area.  You have blood in your stool.  You feel sad or hopeless.  You avoid social or work situations. Get help right away if:  You stop having bowel movements.  You cannot eat or drink without vomiting.  You have rectal bleeding that does not stop.  You have severe pain that is getting worse.  You have symptoms of dehydration, including: ? Sleepiness or fatigue. ? Producing little or no urine, tears, or sweat. ? Dizziness. ? Dry mouth. ? Unusual irritability. ? Headache. ? Inability to think clearly. Summary  Fecal incontinence, also called accidental bowel leakage, is not being able to control your bowels. This condition happens because the nerves or muscles around the anus do not work the way they should.  Treatment varies depending on the cause and severity of your condition. Treatment may also focus on addressing any underlying causes of this condition.  Follow instructions from your health care provider about any eating or drinking  restrictions, lifestyle changes, and skin care.  Take over-the-counter and prescription medicines only as told by your health care provider. This includes any supplements.  Tell your health care provider if your symptoms worsen or if you are upset or depressed about your condition. This information is not intended to replace advice given to you by your health care provider. Make sure you discuss any questions you have with your health care provider. Document Revised: 02/16/2018 Document Reviewed: 02/16/2018 Elsevier Patient Education  2021 [employer].

## 2020-11-10 NOTE — Progress Notes (Signed)
Subjective:    Patient ID: Kevin Stuart, male    DOB: Sep 23, 1951, 70 y.o.   MRN: [id number]  HPI  Patient is a [age]-year-old obese male with OAB who presents to the clinic to discuss new fecal urgency and incontinence.  Is been going on intermittently for the last 2 weeks.  It is not with every bowel movement and it is not every day.  He does admit to starting to drink chocolate milk in the morning and wonders if this could be affecting this.  His stools are more loose than normal.  He does still have some normal soft nonstraining bowel movements.  Patient denies any melena or hematochezia.  Patient denies any abdominal pain, nausea, reflux.  He reports he had a colonoscopy about 2 years ago.  This is not in the system.  Denies any concerns with colonoscopy.  He says it was done in [place].  He has started a new medication for overactive bladder which urology gave him called Logan Bores, he says the fecal problems happen before he started this medication.  He denies any back pain, radicular symptoms, saddle anesthesia.  Marland Kitchen. Active Ambulatory Problems    Diagnosis Date Noted  . Seborrhea capitis 02/03/2012  . Pruritus 07/19/2011  . Hypertension, essential 03/07/2012  . Obsessive-compulsive disorder 11/26/2011  . Itching 07/04/2012  . Hyperlipidemia 10/23/2012  . Marital stress 10/23/2012  . CKD (chronic kidney disease) stage 2, GFR 60-89 ml/min 12/11/2012  . BPH (benign prostatic hyperplasia) 12/13/2014  . Incontinence of feces with fecal urgency 11/11/2020  . OAB (overactive bladder) 11/11/2020   Resolved Ambulatory Problems    Diagnosis Date Noted  . No Resolved Ambulatory Problems   Past Medical History:  Diagnosis Date  . Generalized pruritus 02/03/2012  . Hypertension 03/07/2012  . OCD (obsessive compulsive disorder) 03/23/2012  . Overactive bladder      Review of Systems  Constitutional: Negative for activity change, appetite change, chills, fatigue and fever.  HENT:  Negative.   Respiratory: Negative.   Genitourinary: Negative for flank pain.  Skin: Negative.        Objective:   Physical Exam Vitals reviewed.  Constitutional:      Appearance: Normal appearance. He is obese.  HENT:     Head: Normocephalic.  Cardiovascular:     Rate and Rhythm: Normal rate and regular rhythm.     Pulses: Normal pulses.     Heart sounds: Normal heart sounds.  Pulmonary:     Effort: Pulmonary effort is normal.     Breath sounds: Normal breath sounds.  Abdominal:     General: Bowel sounds are normal. There is no distension.     Palpations: Abdomen is soft.     Tenderness: There is no abdominal tenderness. There is no right CVA tenderness, left CVA tenderness or guarding.  Musculoskeletal:     Right lower leg: No edema.     Left lower leg: No edema.  Neurological:     General: No focal deficit present.     Mental Status: He is alert and oriented to person, place, and time.  Psychiatric:        Mood and Affect: Mood normal.           Assessment & Plan:  Marland KitchenMarland KitchenRichard was seen today for incontinence.  Diagnoses and all orders for this visit:  Incontinence of feces with fecal urgency  OAB (overactive bladder)   Discussed with patient relationship between obesity and fecal incontinence and urgency.  This is not  happening every bowel movement every day.  Encourage patient to keep a diary of the foods he is eating and see what makes worse or better.  Does seem like he should bulk up his stools a little bit with some Metamucil.  Start taking this daily.  Also working on his pelvic floor muscle with Kegel's could be very beneficial for OAB and fecal urgency incontinence.  We could also consider some other medications to help bulk up stool if Metamucil is not enough.  Follow-up with PCP as needed.  No red flags today.  He has had a colonoscopy per patient about 2 years ago.  He says this was at [facility] we will seek to get records.  He denies any melena  or hematochezia.  He denies any abdominal pain.  He denies any low back pain out of normal.  Continue management of OAB via urology.   Spent 30 minutes with patient discussing fecal incontinence and kegal exercises/pelvic floor PT and relationship to OAB.

## 2020-11-11 ENCOUNTER — Other Ambulatory Visit: Payer: Self-pay | Admitting: Physician Assistant

## 2020-11-11 ENCOUNTER — Encounter: Payer: Self-pay | Admitting: Physician Assistant

## 2020-11-11 ENCOUNTER — Encounter: Payer: Self-pay | Admitting: Medical-Surgical

## 2020-11-11 DIAGNOSIS — R159 Full incontinence of feces: Secondary | ICD-10-CM

## 2020-11-11 DIAGNOSIS — R152 Fecal urgency: Secondary | ICD-10-CM

## 2020-11-11 DIAGNOSIS — N3281 Overactive bladder: Secondary | ICD-10-CM | POA: Insufficient documentation

## 2020-11-11 NOTE — Progress Notes (Signed)
Referral to GI placed

## 2020-11-13 ENCOUNTER — Ambulatory Visit: Payer: Medicare Other | Admitting: Physical Therapy

## 2020-11-27 ENCOUNTER — Telehealth: Payer: Self-pay | Admitting: *Deleted

## 2020-11-27 NOTE — Telephone Encounter (Signed)
Transition Care Management Follow-up Telephone Call  Date of discharge and from where: 11/26/2020 - [facility]  How have you been since you were released from the hospital? "Things are not good"  Any questions or concerns? Yes - was seen by Urology today  Items Reviewed:  Did the pt receive and understand the discharge instructions provided? Yes   Medications obtained and verified? Yes   Other? N/A  Any new allergies since your discharge? No   Dietary orders reviewed? Yes  Do you have support at home? Yes   Home Care and Equipment/Supplies: Were home health services ordered? not applicable If so, what is the name of the agency? N/A  Has the agency set up a time to come to the patient's home? not applicable Were any new equipment or medical supplies ordered?  No What is the name of the medical supply agency? N/A Were you able to get the supplies/equipment? not applicable Do you have any questions related to the use of the equipment or supplies? No  Functional Questionnaire: (I = Independent and D = Dependent) ADLs: I  Bathing/Dressing- I  Meal Prep- I  Eating- I  Maintaining continence- I  Transferring/Ambulation- I  Managing Meds- I  Follow up appointments reviewed:   PCP Hospital f/u appt confirmed? No  - pt was very short with me on the phone, did not get a chance to ask if he wanted me to get this set up for him  [facility] f/u appt confirmed? Yes  - pt saw Urology today  Are transportation arrangements needed? No   If their condition worsens, is the pt aware to call PCP or go to the Emergency Dept.? Yes  Was the patient provided with contact information for the PCP's office or ED? Yes  Was to pt encouraged to call back with questions or concerns? Yes

## 2020-12-01 ENCOUNTER — Telehealth: Payer: Self-pay

## 2020-12-01 NOTE — Telephone Encounter (Signed)
It appears that he has scheduled a visit via MyChart for tomorrow. Thank you for the notification.

## 2020-12-01 NOTE — Telephone Encounter (Signed)
Pt is currently taking ibuprofen 800 mg q8hrs prn for arthritis pain. He recently had a prostate biopsy on 11/11/2020 and has to use a catheter in order to void. Pt is wanting to know if it is okay for him to continue taking the ibuprofen prn. Please advise.

## 2020-12-01 NOTE — Telephone Encounter (Signed)
Pt aware of Joy's recommendations. No further questions or concerns at this time.

## 2020-12-01 NOTE — Telephone Encounter (Signed)
If he is experiencing any bleeding (in the urine or rectally), he should stop the medication. If not having any bleeding, okay to continue it but would recommend using it sparingly and only when needed. Chronic treatment with high dose ibuprofen has the risk of causing stomach issues such as GI bleed, ulcers, etc. It also puts strain on the kidneys so we will have to watch renal function closely (every 3-6 months or so).

## 2020-12-01 NOTE — Telephone Encounter (Signed)
PT called front desk saying he spoke to [place]. He said, "when is joy's next appt", I then asked him, "what were you needing to be seen for?". He then said, "forget it" and hung up.

## 2020-12-01 NOTE — Telephone Encounter (Signed)
Patient aware via voicemail and portal message that he needs to come in for an OV with Joy to sit down face-to-face to discuss his concerns. Instructed patient to call the office for scheduling. Kevin Stuart

## 2020-12-02 ENCOUNTER — Encounter: Payer: Self-pay | Admitting: Medical-Surgical

## 2020-12-02 ENCOUNTER — Other Ambulatory Visit: Payer: Self-pay

## 2020-12-02 ENCOUNTER — Ambulatory Visit (INDEPENDENT_AMBULATORY_CARE_PROVIDER_SITE_OTHER): Payer: Medicare Other | Admitting: Medical-Surgical

## 2020-12-02 VITALS — BP 167/75 | HR 70 | Temp 98.6°F | Ht 68.5 in | Wt 291.7 lb

## 2020-12-02 DIAGNOSIS — R338 Other retention of urine: Secondary | ICD-10-CM

## 2020-12-02 DIAGNOSIS — N401 Enlarged prostate with lower urinary tract symptoms: Secondary | ICD-10-CM | POA: Diagnosis not present

## 2020-12-02 DIAGNOSIS — I10 Essential (primary) hypertension: Secondary | ICD-10-CM

## 2020-12-02 DIAGNOSIS — M5030 Other cervical disc degeneration, unspecified cervical region: Secondary | ICD-10-CM

## 2020-12-02 MED ORDER — LISINOPRIL 10 MG PO TABS: 10.0000 mg | ORAL_TABLET | Freq: Every day | ORAL | 3 refills | Status: DC

## 2020-12-02 NOTE — Progress Notes (Signed)
Subjective:    CC: many questions  HPI: 70 year old male presenting with quite a few questions today surrounding his prostate biopsy and associated sequela. Currently has a foley catheter in place with a large drainage bag. Notes no hematuria in the last 3-4 days. Wonders if why he has to have the catheter and why he cannot pee normally. Feels the biopsy was a bad decision due to his complications. Thinks he could still have prostate cancer even though the biopsy was negative for malignancy. Wants to know if dairy and sugar are harmful to the prostate. Wonders if it is okay to take saw palmetto for his prostate. Also interested in taking pumpkin seed oil and wants to know my thoughts on the herbal treatment options. Reports he has been researching online to see what the [facility] clinic and other reputable resources say on the subject matter. Currently taking Flomax and Vantin as prescribed. Will follow up with Urology next week for a voiding trial but may have to have the catheter replaced if he cannot pee on his own.   Taking Ibuprofen for his cervical degeneration and left arm symptoms. This has been very helpful and he is doing much better. Has been cutting the Ibuprofen in half as he thought that the medication may be harmful to the prostate. Wants to know if it is okay to take the full dose.   HTN- taking Amlodipine 10mg  daily. Previously treated with a "fluid pill" but not sure the name or the dose. Per records, previously treated with Lisinopril 10mg  daily along with Amlodipine. Not following a low sodium diet and notes quite a bit of stress lately. Denies CP, SOB, palpitations, lower extremity edema, dizziness, headaches, or vision changes.  I reviewed the past medical history, family history, social history, surgical history, and allergies today and no changes were needed.  Please see the problem list section below in epic for further details.  Past Medical History: Past Medical History:   Diagnosis Date  . Generalized pruritus 02/03/2012  . Hyperlipidemia   . Hypertension 03/07/2012  . Itching 07/04/2012  . OCD (obsessive compulsive disorder) 03/23/2012  . Overactive bladder   . Seborrhea capitis 02/03/2012   Past Surgical History: Past Surgical History:  Procedure Laterality Date  . GASTRIC BYPASS    . VASECTOMY     Social History: Social History   Socioeconomic History  . Marital status: Married    Spouse name: Not on file  . Number of children: Not on file  . Years of education: Not on file  . Highest education level: Not on file  Occupational History  . Not on file  Tobacco Use  . Smoking status: Never Smoker  . Smokeless tobacco: Never Used  Vaping Use  . Vaping Use: Never used  Substance and Sexual Activity  . Alcohol use: Yes    Comment: Rarely  . Drug use: Never  . Sexual activity: Not Currently    Partners: Female    Birth control/protection: Abstinence  Other Topics Concern  . Not on file  Social History Narrative  . Not on file   Social Determinants of Health   Financial Resource Strain: Not on file  Food Insecurity: Not on file  Transportation Needs: Not on file  Physical Activity: Not on file  Stress: Not on file  Social Connections: Not on file   Family History: Family History  Problem Relation Age of Onset  . Breast cancer Mother    Allergies: No Known Allergies Medications: See med  rec.  Review of Systems: See HPI for pertinent positives and negatives.   Objective:    General: Well Developed, well nourished, and in no acute distress.  Neuro: Alert and oriented x3.  HEENT: Normocephalic, atraumatic.  Skin: Warm and dry. Cardiac: Regular rate and rhythm, no murmurs rubs or gallops, no lower extremity edema.  Respiratory: Clear to auscultation bilaterally. Not using accessory muscles, speaking in full sentences. Urinary: Foley catheter in place with clear, yellow urine present in tubing and bag.   Impression and  Recommendations:    1. Benign prostatic hyperplasia with urinary retention Discussed the nature of BPH and the factors that may have predisposed him to his current issues. Questions answered and resources for information discussed. Recommend follow up with urology as scheduled as they are managing his prostate issues and urinary symptoms.   2. DDD (degenerative disc disease), cervical Since no acute bleeding, okay to continue Ibuprofen as prescribed.   3. Hypertension, essential BP not at goal for several appointments. Continue Amlodipine. Adding back Lisinopril 10mg  daily. Recommend low sodium diet.   Return in about 2 weeks (around 12/16/2020) for nurse visit for BP check. ___________________________________________ Clearnce Sorrel, DNP, APRN, FNP-BC [facility] and [place]

## 2020-12-08 ENCOUNTER — Other Ambulatory Visit: Payer: Self-pay

## 2020-12-08 ENCOUNTER — Ambulatory Visit (INDEPENDENT_AMBULATORY_CARE_PROVIDER_SITE_OTHER): Payer: Medicare Other | Admitting: Medical-Surgical

## 2020-12-08 ENCOUNTER — Encounter: Payer: Self-pay | Admitting: Medical-Surgical

## 2020-12-08 VITALS — BP 143/77 | HR 60 | Temp 98.9°F | Resp 20 | Ht 68.5 in | Wt 293.0 lb

## 2020-12-08 DIAGNOSIS — I10 Essential (primary) hypertension: Secondary | ICD-10-CM | POA: Diagnosis not present

## 2020-12-08 DIAGNOSIS — R221 Localized swelling, mass and lump, neck: Secondary | ICD-10-CM

## 2020-12-08 DIAGNOSIS — E221 Hyperprolactinemia: Secondary | ICD-10-CM | POA: Insufficient documentation

## 2020-12-08 MED ORDER — AMLODIPINE BESYLATE 10 MG PO TABS: 10.0000 mg | ORAL_TABLET | Freq: Every day | ORAL | 1 refills | Status: DC

## 2020-12-08 MED ORDER — PRAVASTATIN SODIUM 40 MG PO TABS: 40.0000 mg | ORAL_TABLET | Freq: Every day | ORAL | 1 refills | Status: DC

## 2020-12-08 NOTE — Progress Notes (Signed)
Subjective:    CC: swelling on right neck  HPI: 70 year old male presenting today for evaluation of a swollen area on the right lateral neck at the junction of neck and shoulder. First noticed the swelling on Saturday. Notes that the area is not painful or tender. No fevers or associated symptoms.   Has new onset BLE edema, slightly worse on the right. Wants to know if this could be related to Lisinopril since we started that recently. Has good urine production in his foley catheter. No shortness of breath or cough.   I reviewed the past medical history, family history, social history, surgical history, and allergies today and no changes were needed.  Please see the problem list section below in epic for further details.  Past Medical History: Past Medical History:  Diagnosis Date  . Generalized pruritus 02/03/2012  . Hyperlipidemia   . Hypertension 03/07/2012  . Itching 07/04/2012  . OCD (obsessive compulsive disorder) 03/23/2012  . Overactive bladder   . Seborrhea capitis 02/03/2012   Past Surgical History: Past Surgical History:  Procedure Laterality Date  . GASTRIC BYPASS    . VASECTOMY     Social History: Social History   Socioeconomic History  . Marital status: Married    Spouse name: Not on file  . Number of children: Not on file  . Years of education: Not on file  . Highest education level: Not on file  Occupational History  . Not on file  Tobacco Use  . Smoking status: Never Smoker  . Smokeless tobacco: Never Used  Vaping Use  . Vaping Use: Never used  Substance and Sexual Activity  . Alcohol use: Yes    Comment: Rarely  . Drug use: Never  . Sexual activity: Not Currently    Partners: Female    Birth control/protection: Abstinence  Other Topics Concern  . Not on file  Social History Narrative  . Not on file   Social Determinants of Health   Financial Resource Strain: Not on file  Food Insecurity: Not on file  Transportation Needs: Not on file   Physical Activity: Not on file  Stress: Not on file  Social Connections: Not on file   Family History: Family History  Problem Relation Age of Onset  . Breast cancer Mother    Allergies: No Known Allergies Medications: See med rec.  Review of Systems: See HPI for pertinent positives and negatives.   Objective:    General: Well Developed, well nourished, and in no acute distress.  Neuro: Alert and oriented x3.  HEENT: Normocephalic, atraumatic. 3.5cm round x 1cm area of swelling to the lateral right neck, soft, nontender without erythema. No nodularity.   Skin: Warm and dry. Cardiac: Regular rate and rhythm. 2-3+ BLE edema of the feet and lower legs.  Respiratory: Not using accessory muscles, speaking in full sentences.   Impression and Recommendations:    1. Localized swelling, mass and lump, neck Checking CBC w/diff. Getting [place] of soft tissue of the neck to determine if swelling is fatty tissue or a fluid collection. May need biopsy and/or drainage depending on [place] results.  - US Soft Tissue Head/Neck (NON-THYROID); Future - CBC w/Diff/Platelet - COMPLETE METABOLIC PANEL WITH GFR  2. Primary hypertension Checking CMP to evaluate renal function. For now, continue Amlodipine 10mg  and Lisinopril 10mg  daily. Consider amlodipine as contributing agent to swelling although he has been on this for years without a problem. If renal function normal, may need to reevaluate HTN regimen.  - amLODipine (  NORVASC) 10 MG tablet; Take 1 tablet (10 mg total) by mouth daily.  Dispense: 90 tablet; Refill: 1  Return if symptoms worsen or fail to improve. ___________________________________________ Clearnce Sorrel, DNP, APRN, FNP-BC [facility] and [place]

## 2020-12-09 LAB — CBC WITH DIFFERENTIAL/PLATELET
Absolute Monocytes: 539 cells/uL (ref 200–950)
Basophils Absolute: 69 cells/uL (ref 0–200)
Basophils Relative: 0.9 %
Eosinophils Absolute: 154 cells/uL (ref 15–500)
Eosinophils Relative: 2 %
HCT: 42.6 % (ref 38.5–50.0)
Hemoglobin: 14 g/dL (ref 13.2–17.1)
Lymphs Abs: 2503 cells/uL (ref 850–3900)
MCH: 26.7 pg — ABNORMAL LOW (ref 27.0–33.0)
MCHC: 32.9 g/dL (ref 32.0–36.0)
MCV: 81.1 fL (ref 80.0–100.0)
MPV: 12.1 fL (ref 7.5–12.5)
Monocytes Relative: 7 %
Neutro Abs: 4435 cells/uL (ref 1500–7800)
Neutrophils Relative %: 57.6 %
Platelets: 284 10*3/uL (ref 140–400)
RBC: 5.25 10*6/uL (ref 4.20–5.80)
RDW: 14 % (ref 11.0–15.0)
Total Lymphocyte: 32.5 %
WBC: 7.7 10*3/uL (ref 3.8–10.8)

## 2020-12-09 LAB — COMPLETE METABOLIC PANEL WITH GFR
AG Ratio: 1.4 (calc) (ref 1.0–2.5)
ALT: 21 U/L (ref 9–46)
AST: 19 U/L (ref 10–35)
Albumin: 4.3 g/dL (ref 3.6–5.1)
Alkaline phosphatase (APISO): 74 U/L (ref 35–144)
BUN: 15 mg/dL (ref 7–25)
CO2: 28 mmol/L (ref 20–32)
Calcium: 9.6 mg/dL (ref 8.6–10.3)
Chloride: 103 mmol/L (ref 98–110)
Creat: 0.84 mg/dL (ref 0.70–1.25)
GFR, Est African American: 105 mL/min/{1.73_m2} (ref >=60)
GFR, Est Non African American: 91 mL/min/{1.73_m2} (ref >=60)
Globulin: 3.1 g/dL (calc) (ref 1.9–3.7)
Glucose, Bld: 96 mg/dL (ref 65–99)
Potassium: 3.9 mmol/L (ref 3.5–5.3)
Sodium: 141 mmol/L (ref 135–146)
Total Bilirubin: 0.4 mg/dL (ref 0.2–1.2)
Total Protein: 7.4 g/dL (ref 6.1–8.1)

## 2020-12-10 ENCOUNTER — Other Ambulatory Visit: Payer: Self-pay

## 2020-12-10 ENCOUNTER — Other Ambulatory Visit: Payer: Self-pay | Admitting: Medical-Surgical

## 2020-12-10 ENCOUNTER — Ambulatory Visit (INDEPENDENT_AMBULATORY_CARE_PROVIDER_SITE_OTHER): Payer: Medicare Other

## 2020-12-10 DIAGNOSIS — R221 Localized swelling, mass and lump, neck: Secondary | ICD-10-CM

## 2020-12-10 IMAGING — US US SOFT TISSUE HEAD/NECK
1 series · 7 of 7 positions shown · non-contrast
Comparison: No pertinent prior exams available for comparison.

CLINICAL DATA: Swelling of right lateral neck. Additional history
provided by scanning technologist: Patient reports lump/swelling
lateral right neck for 4 days, no pain.

EXAM:
ULTRASOUND OF HEAD/NECK SOFT TISSUES
TECHNIQUE: Ultrasound examination of the head and neck soft tissues was
performed in the area of clinical concern.

[Series 1: us soft tissue head/neck · 0.06mm/px · 7 acquisitions, 7 frames shown]
[im 1/7]
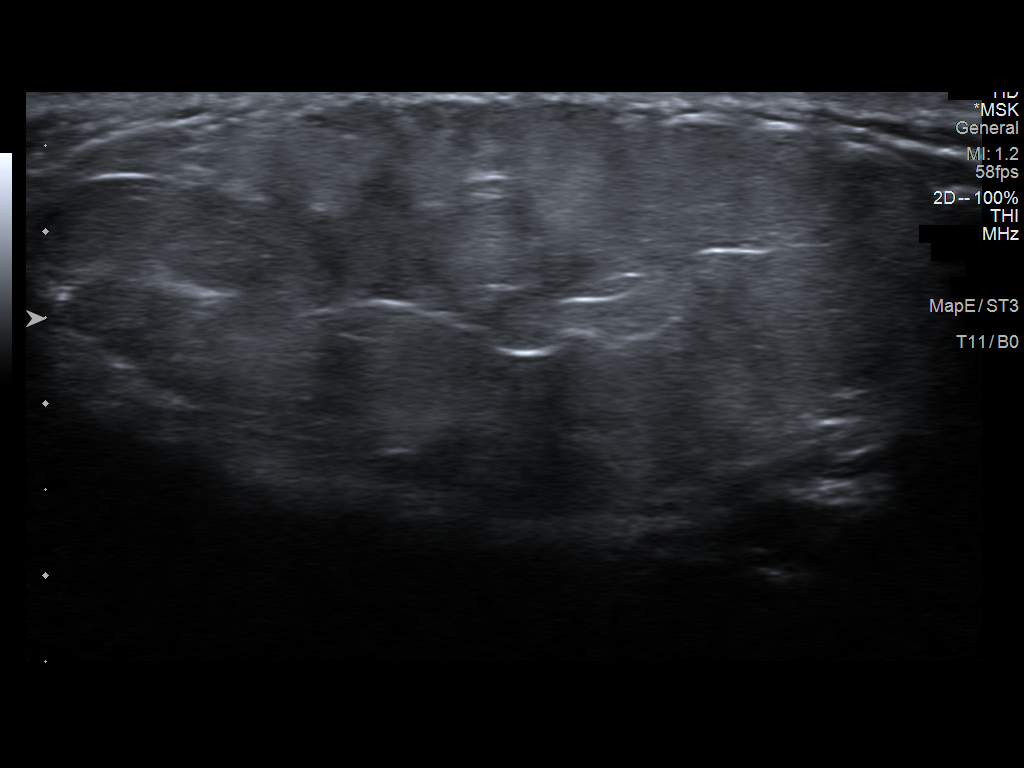
[im 2/7]
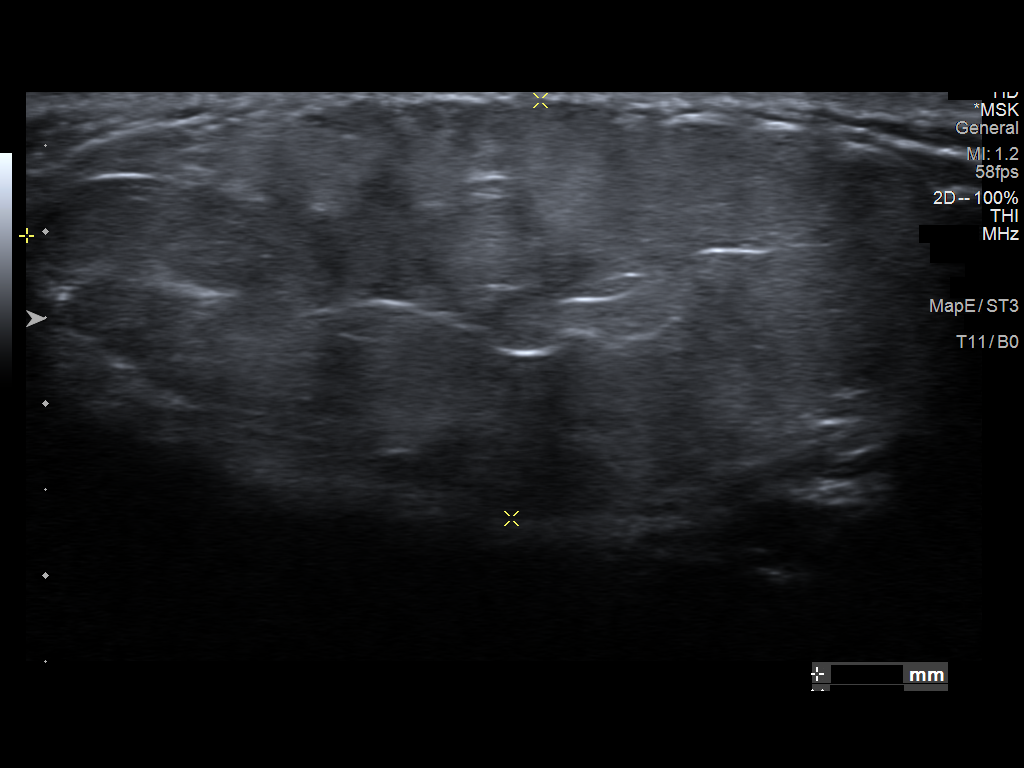
[im 3/7]
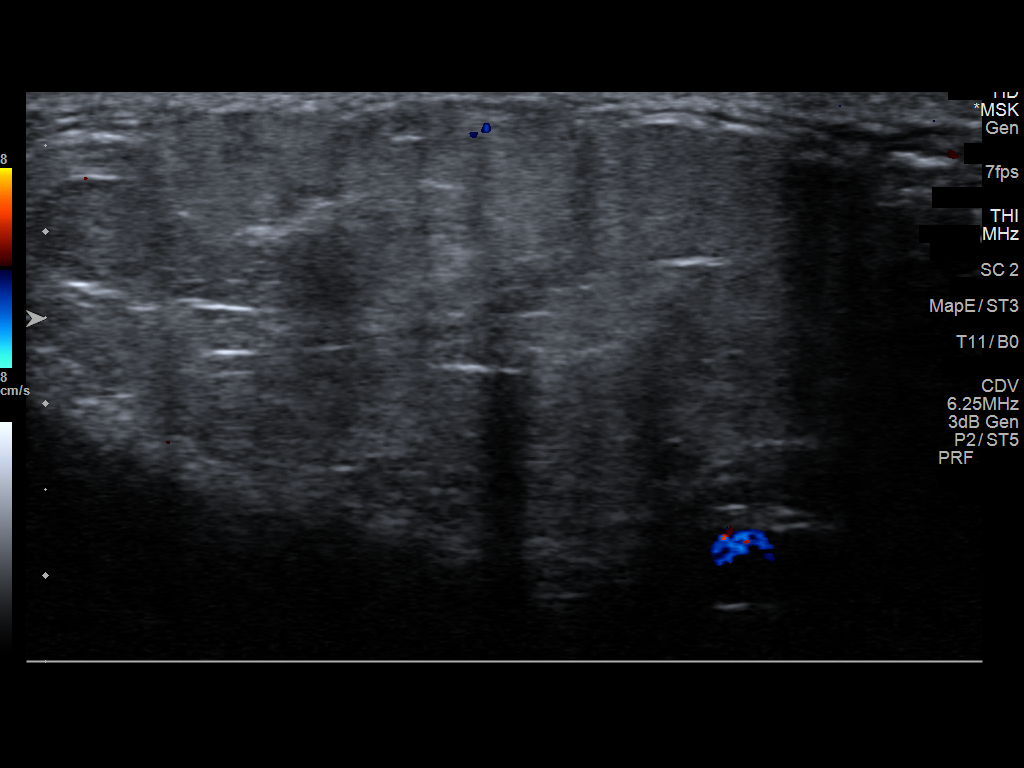
[im 4/7]
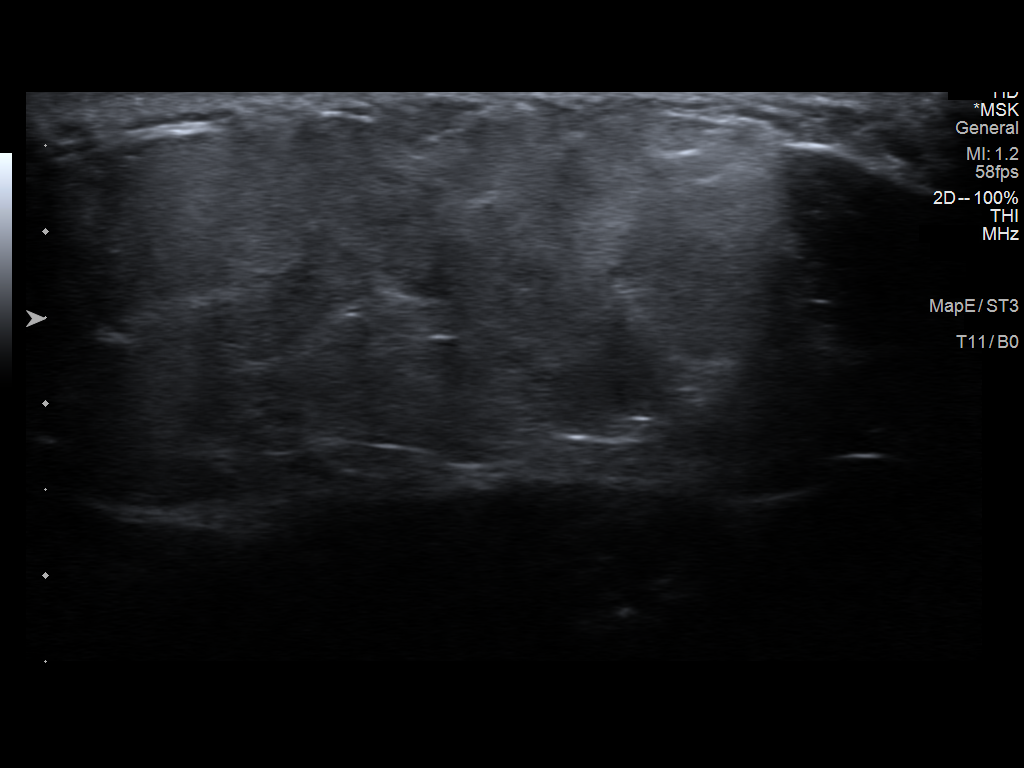
[im 5/7]
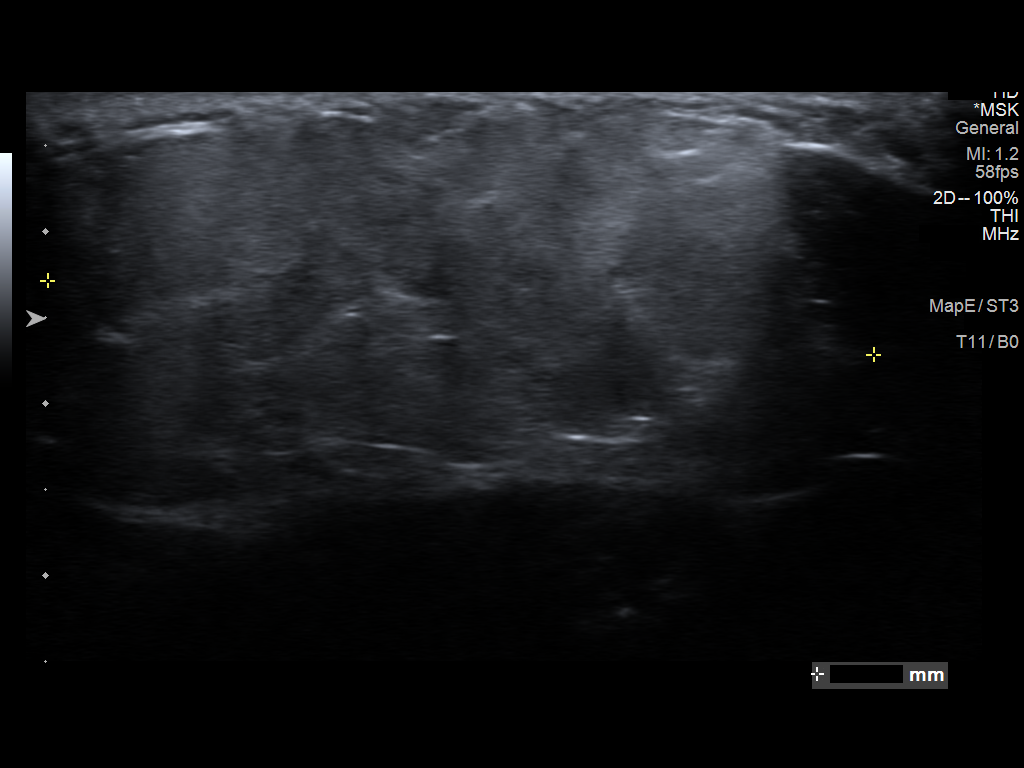
[im 6/7]
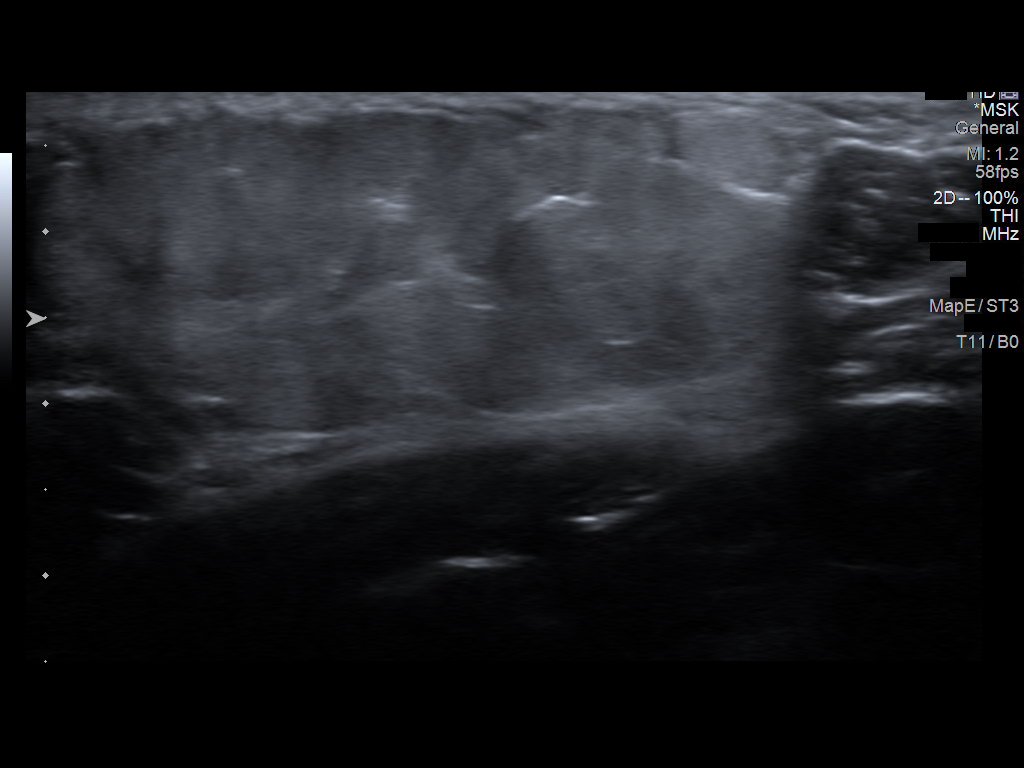
[im 7/7]
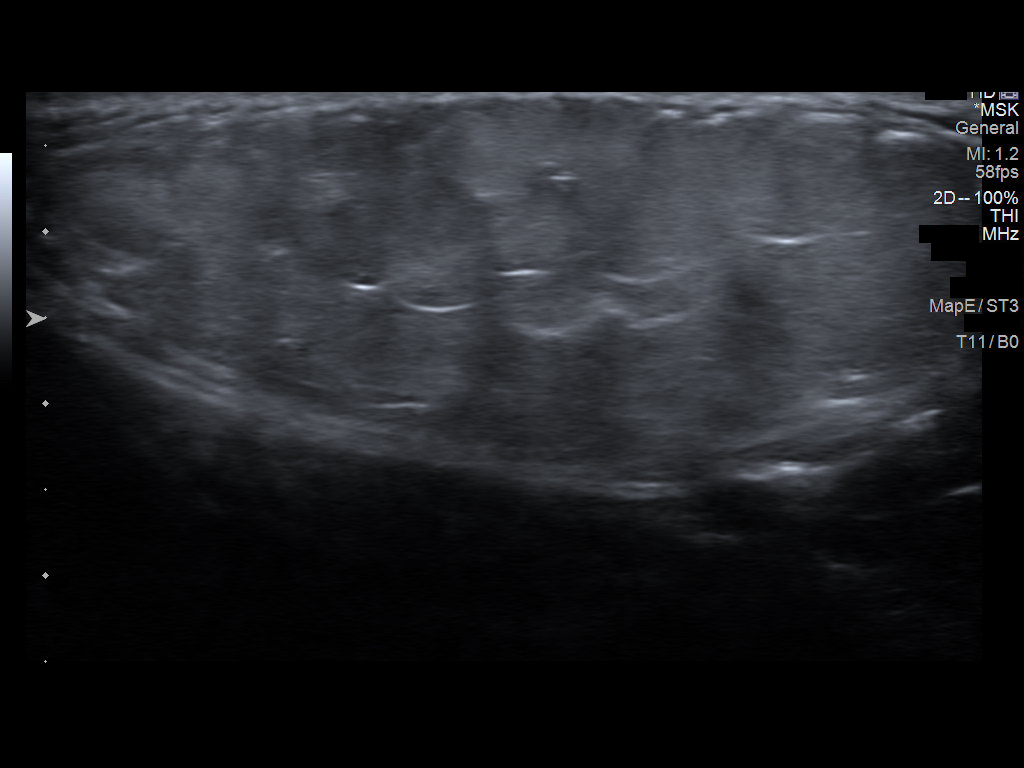

[7 of 7 positions shown; findings below may reference images not displayed]

FINDINGS: Targeted ultrasound was performed in the right neck region of
concern. At this site, there is a 5.5 x 2.4 x 4.8 cm ovoid
subcutaneous mass. The mass is well-circumscribed. The mass has
internal heterogeneous echotexture but is predominantly hyperechoic
relative to background subcutaneous fat.
IMPRESSION: 5.5 x 2.4 x 4.8 cm ovoid subcutaneous mass within the right neck
region of concern. This mass has some sonographic imaging features
to suggest a lipoma. However, the sonographic appearance is
nonspecific and a neck CT should be considered for further
evaluation and to exclude alternative etiologies.

## 2020-12-15 ENCOUNTER — Ambulatory Visit (INDEPENDENT_AMBULATORY_CARE_PROVIDER_SITE_OTHER): Payer: Medicare Other

## 2020-12-15 ENCOUNTER — Other Ambulatory Visit: Payer: Self-pay

## 2020-12-15 DIAGNOSIS — R221 Localized swelling, mass and lump, neck: Secondary | ICD-10-CM

## 2020-12-15 IMAGING — CT CT NECK W/ CM
3 of 4 series · 13 of 33 positions shown, 16 images · IV contrast (omnipaque)
Comparison: None.

CLINICAL DATA: Neck mass, possible lipoma on ultrasound

EXAM:
CT NECK WITH CONTRAST
TECHNIQUE: Multidetector CT imaging of the neck was performed using the
standard protocol following the bolus administration of intravenous
contrast.
CONTRAST:  75mL OMNIPAQUE IOHEXOL 300 MG/ML  SOLN

[Series 4: sag neck · sagittal · 0.51mm/px · 5 of 101 slices shown, 6 images]
[im 34/101  bone]
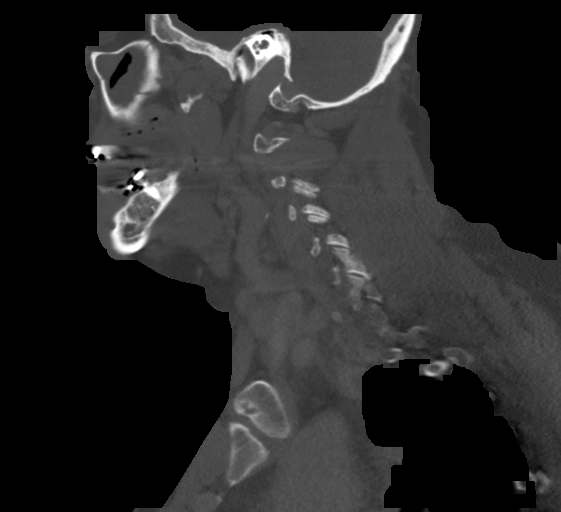
[im 42/101  bone]
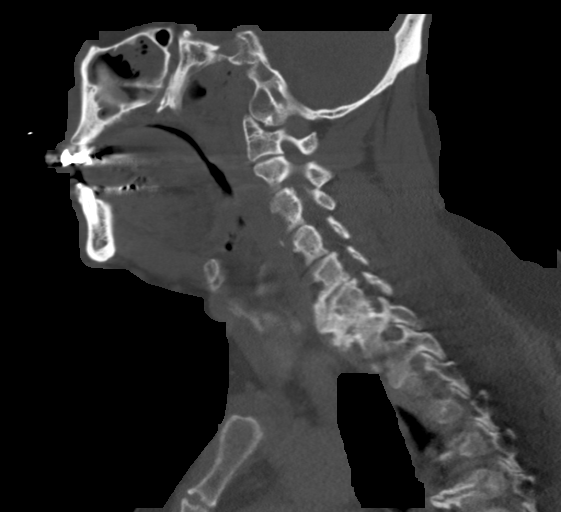
[im 51/101  soft-tissue]
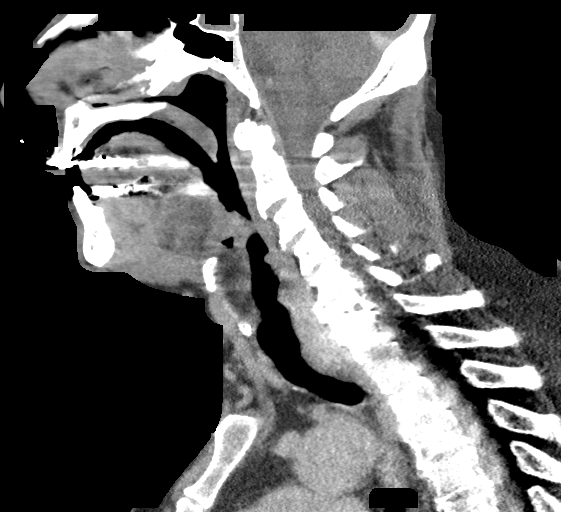
[im 51/101  bone]
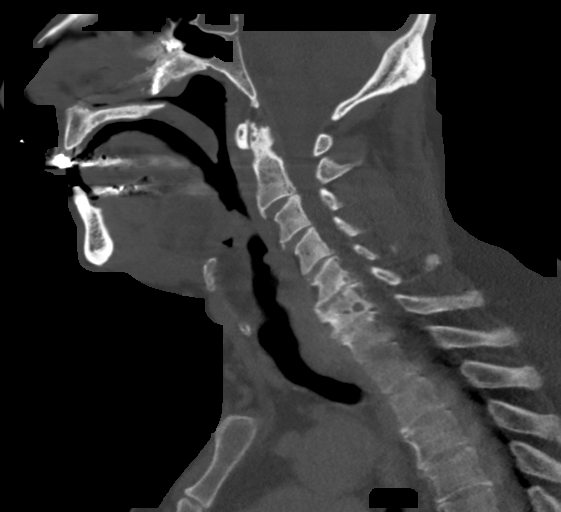
[im 59/101  bone]
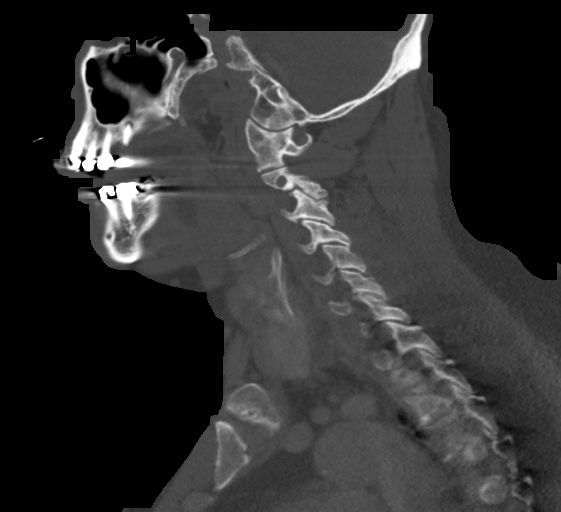
[im 67/101  bone]
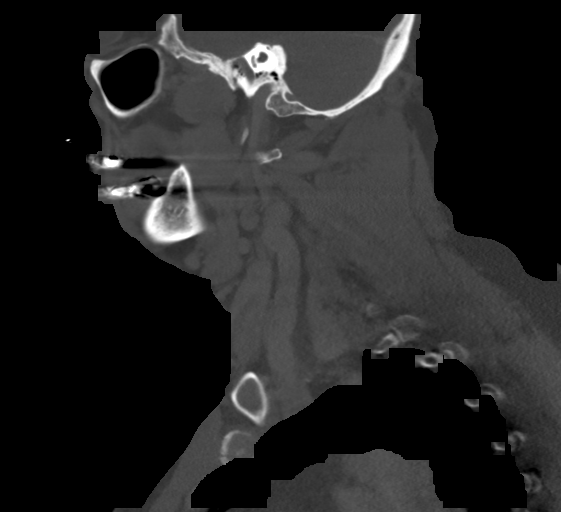

[Series 5: cor neck · coronal · 0.47mm/px · 3 of 155 slices shown]
[im 31/155  bone]
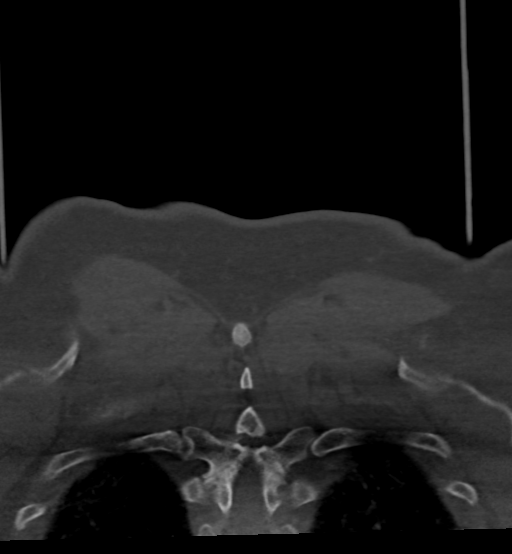
[im 62/155  bone]
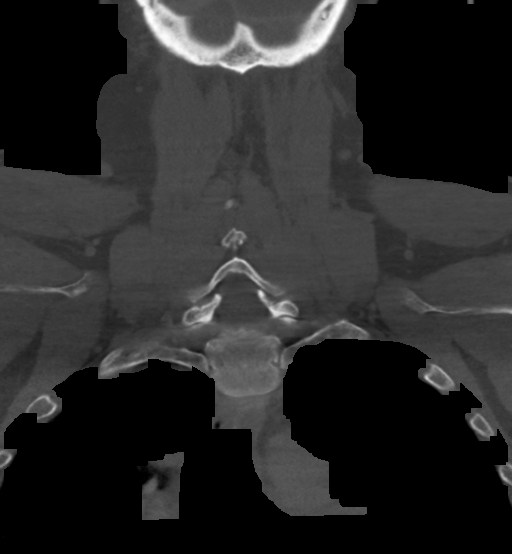
[im 93/155  bone]
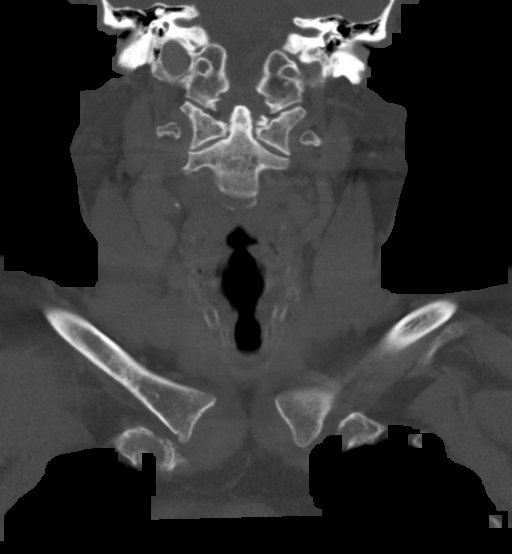

[Series 6: orthogonal (person_name) · axial · 0.39mm/px · z∈[-195,-25]mm · 5 of 129 slices shown, 7 images]
[im 22/129  soft-tissue]
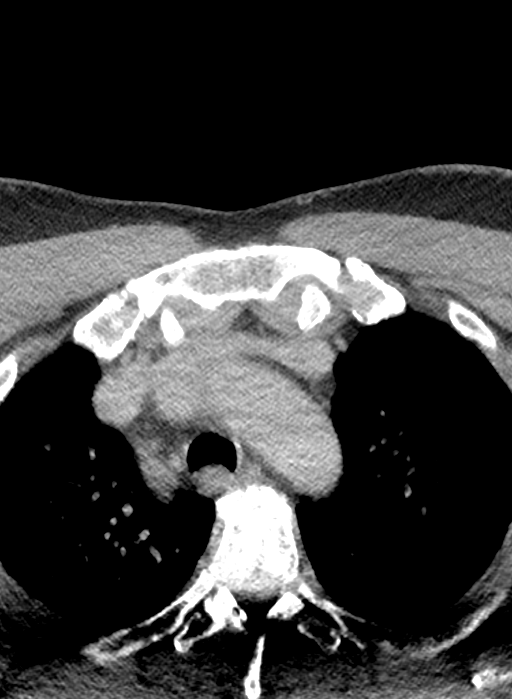
[im 22/129  bone]
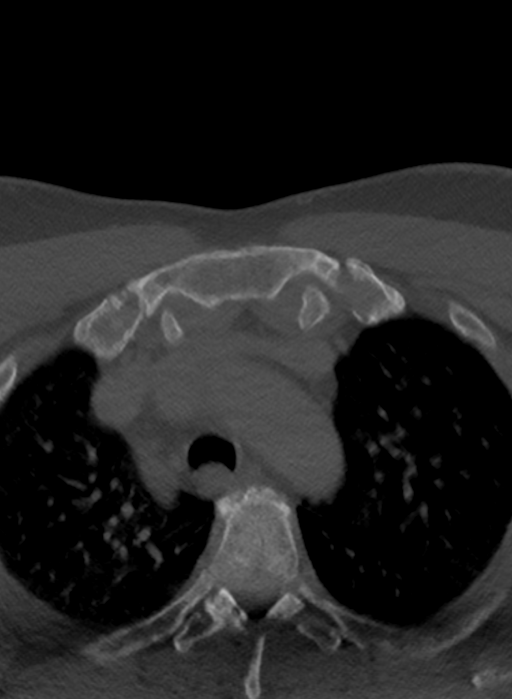
[im 43/129  bone]
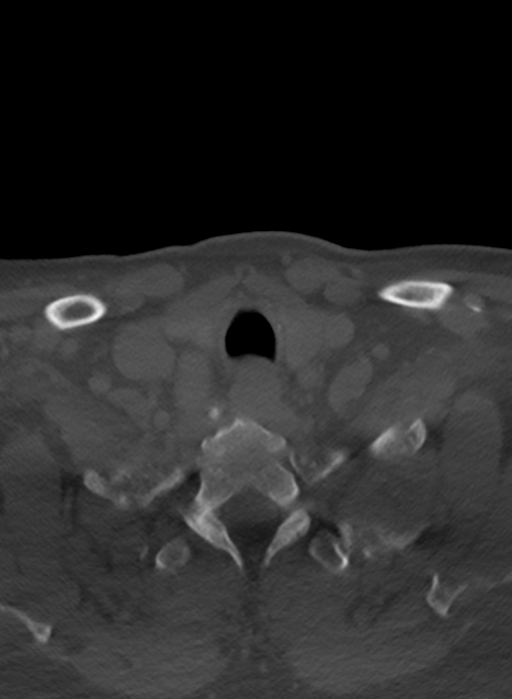
[im 65/129  bone]
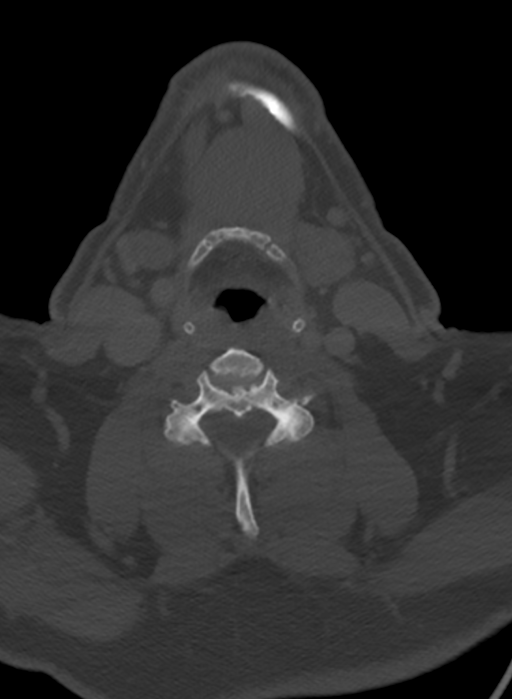
[im 86/129  bone]
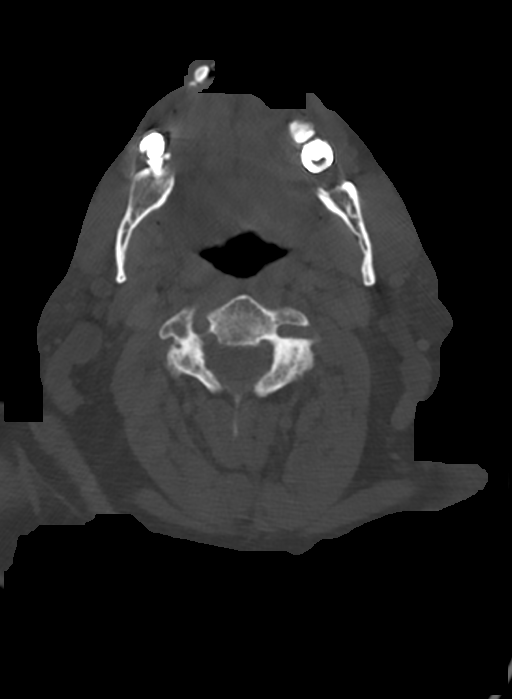
[im 107/129  soft-tissue]
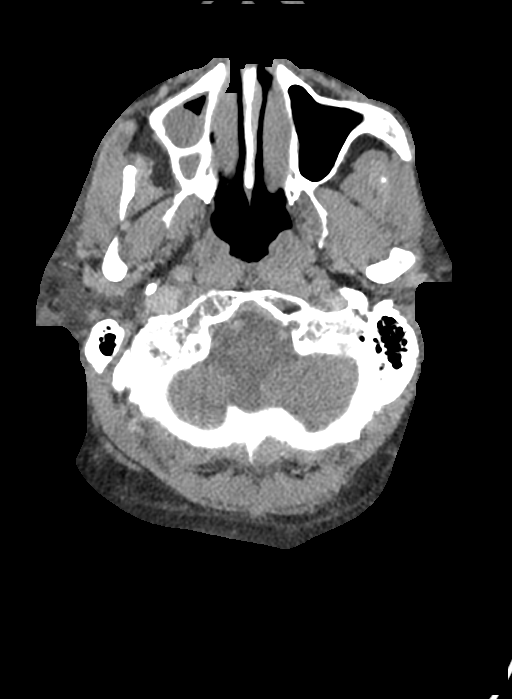
[im 107/129  bone]
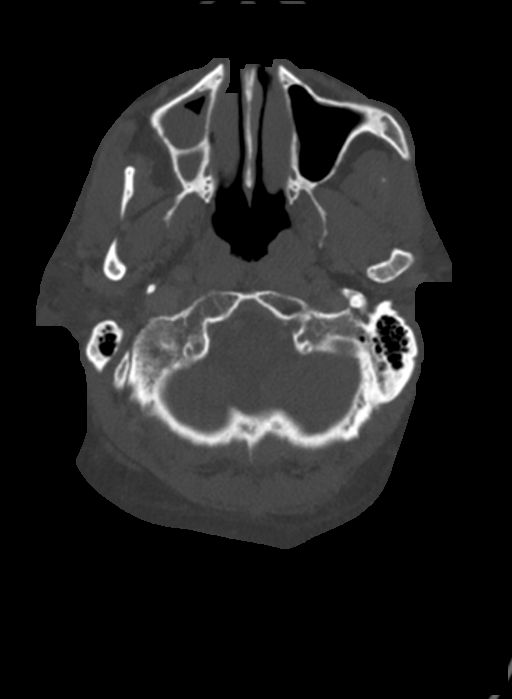

[13 of 33 positions shown; findings below may reference images not displayed]

FINDINGS: Poor contrast opacification.

Pharynx and larynx: Unremarkable.  No mass or swelling.

Salivary glands: 2 punctate left parotid stones. Otherwise
unremarkable.

Thyroid: Normal.

Lymph nodes: No enlarged lymph nodes.

Vascular: No significant abnormality is identified. There is poor
contrast opacification.

Limited intracranial: Unremarkable.

Visualized orbits: Unremarkable.

Mastoids and visualized paranasal sinuses: Right maxillary sinus
lobular mucosal thickening with evidence chronic inflammation.
Mastoid air cells are clear.

Skeleton: Advanced cervical spine degenerative changes.

Upper chest: No apical lung mass.

Other: Underlying the posterior right neck skin marker, there is no
discrete lesion but relatively increased fat suggesting presence of
lipoma as suspected.
IMPRESSION: Asymmetric fat in the palpable area suggesting presence of lipoma.

## 2020-12-15 MED ORDER — IOHEXOL 300 MG/ML  SOLN
100.0000 mL | Freq: Once | INTRAMUSCULAR | Status: AC | PRN
  Administered 2020-12-15: 75 mL via INTRAVENOUS

## 2020-12-16 ENCOUNTER — Ambulatory Visit: Payer: Medicare Other

## 2020-12-17 ENCOUNTER — Other Ambulatory Visit: Payer: Self-pay

## 2020-12-17 ENCOUNTER — Ambulatory Visit (INDEPENDENT_AMBULATORY_CARE_PROVIDER_SITE_OTHER): Payer: Medicare Other | Admitting: Medical-Surgical

## 2020-12-17 VITALS — BP 139/74 | HR 61 | Resp 22

## 2020-12-17 DIAGNOSIS — I10 Essential (primary) hypertension: Secondary | ICD-10-CM

## 2020-12-17 NOTE — Progress Notes (Signed)
Plan to follow up in 3 months for hypertension. Continue current regimen and limit dietary sodium.

## 2020-12-17 NOTE — Progress Notes (Signed)
Established Patient Office Visit  Subjective:  Patient ID: Kevin Stuart, male    DOB: [birth year]  Age: 70 y.o. MRN: [id number]  CC:  Chief Complaint  Patient presents with  . Hypertension    HPI Kevin Stuart presents for a BP check. BP reading today is 137/74.   Past Medical History:  Diagnosis Date  . Generalized pruritus 02/03/2012  . Hyperlipidemia   . Hypertension 03/07/2012  . Itching 07/04/2012  . OCD (obsessive compulsive disorder) 03/23/2012  . Overactive bladder   . Seborrhea capitis 02/03/2012    Past Surgical History:  Procedure Laterality Date  . GASTRIC BYPASS    . VASECTOMY      Family History  Problem Relation Age of Onset  . Breast cancer Mother     Social History   Socioeconomic History  . Marital status: Married    Spouse name: Not on file  . Number of children: Not on file  . Years of education: Not on file  . Highest education level: Not on file  Occupational History  . Not on file  Tobacco Use  . Smoking status: Never Smoker  . Smokeless tobacco: Never Used  Vaping Use  . Vaping Use: Never used  Substance and Sexual Activity  . Alcohol use: Yes    Comment: Rarely  . Drug use: Never  . Sexual activity: Not Currently    Partners: Female    Birth control/protection: Abstinence  Other Topics Concern  . Not on file  Social History Narrative  . Not on file   Social Determinants of Health   Financial Resource Strain: Not on file  Food Insecurity: Not on file  Transportation Needs: Not on file  Physical Activity: Not on file  Stress: Not on file  Social Connections: Not on file  Intimate Partner Violence: Not on file    Outpatient Medications Prior to Visit  Medication Sig Dispense Refill  . amLODipine (NORVASC) 10 MG tablet Take 1 tablet (10 mg total) by mouth daily. 90 tablet 1  . aspirin 81 MG tablet Take 81 mg by mouth daily.    . cefpodoxime (VANTIN) 200 MG tablet Take 200 mg by mouth 2 (two) times daily.    Marland Kitchen GEMTESA 75  MG TABS Take 1 tablet by mouth daily.    Marland Kitchen ibuprofen (ADVIL) 800 MG tablet Take 1 tablet (800 mg total) by mouth every 8 (eight) hours as needed. 60 tablet 1  . lisinopril (ZESTRIL) 10 MG tablet Take 1 tablet (10 mg total) by mouth daily. 90 tablet 3  . pravastatin (PRAVACHOL) 40 MG tablet Take 1 tablet (40 mg total) by mouth daily. 90 tablet 1  . tamsulosin (FLOMAX) 0.4 MG CAPS capsule Take 0.4 mg by mouth 2 (two) times daily.     No facility-administered medications prior to visit.    No Known Allergies  ROS Review of Systems    Objective:    Physical Exam  BP 139/74 (BP Location: Right Arm, Patient Position: Sitting, Cuff Size: Large)   Pulse 61   Resp (!) 22   SpO2 99%  Wt Readings from Last 3 Encounters:  12/08/20 293 lb (132.9 kg)  12/02/20 291 lb 11.2 oz (132.3 kg)  11/10/20 295 lb (133.8 kg)     There are no preventive care reminders to display for this patient.  There are no preventive care reminders to display for this patient.  No results found for: TSH Lab Results  Component Value Date   WBC 7.7 12/08/2020  HGB 14.0 12/08/2020   HCT 42.6 12/08/2020   MCV 81.1 12/08/2020   PLT 284 12/08/2020   Lab Results  Component Value Date   NA 141 12/08/2020   K 3.9 12/08/2020   CO2 28 12/08/2020   GLUCOSE 96 12/08/2020   BUN 15 12/08/2020   CREATININE 0.84 12/08/2020   BILITOT 0.4 12/08/2020   ALKPHOS 76 02/23/2012   AST 19 12/08/2020   ALT 21 12/08/2020   PROT 7.4 12/08/2020   ALBUMIN 4.3 02/23/2012   CALCIUM 9.6 12/08/2020   Lab Results  Component Value Date   CHOL 230 (H) 10/23/2012   Lab Results  Component Value Date   HDL 49 10/23/2012   Lab Results  Component Value Date   LDLCALC 159 (H) 10/23/2012   Lab Results  Component Value Date   TRIG 112 10/23/2012   Lab Results  Component Value Date   CHOLHDL 4.7 10/23/2012   Lab Results  Component Value Date   HGBA1C 5.6 08/22/2012      Assessment & Plan:  BP within normal limits.   Problem List Items Addressed This Visit   None     No orders of the defined types were placed in this encounter.   Follow-up: No follow-ups on file.    Ninfa Meeker, CMA

## 2020-12-19 MED ORDER — IBUPROFEN 800 MG PO TABS: 800.0000 mg | ORAL_TABLET | Freq: Three times a day (TID) | ORAL | 1 refills | Status: DC | PRN

## 2020-12-22 ENCOUNTER — Telehealth: Payer: Self-pay

## 2020-12-22 NOTE — Telephone Encounter (Signed)
Patient stopped by to fill out a form for a Disability Parking Placard. Patient would like a call when form is completed @ [phone] and wants to be alerted in mychart that it's completed.

## 2020-12-22 NOTE — Telephone Encounter (Signed)
Parking placard form that patient had filled out was for Dr. Sheppard Coil. Since he has not seen her, I have completed a new form with my information. He will need to sign it before taking it to the [facility]. It is located in the file folder at the front desk. Sending him a message that it is complete and ready for pickup as requested.

## 2020-12-22 NOTE — Telephone Encounter (Signed)
Called patient and LVM to pick up forms and fill his part out because the form he had was Dr. Redgie Grayer.

## 2020-12-23 ENCOUNTER — Other Ambulatory Visit: Payer: Self-pay | Admitting: Medical-Surgical

## 2020-12-23 MED ORDER — LIDOCAINE HCL URETHRAL/MUCOSAL 2 % EX GEL: 1.0000 "application " | CUTANEOUS | 2 refills | Status: DC | PRN

## 2021-01-29 ENCOUNTER — Encounter: Payer: Self-pay | Admitting: Medical-Surgical

## 2021-01-29 DIAGNOSIS — H9190 Unspecified hearing loss, unspecified ear: Secondary | ICD-10-CM

## 2021-02-24 ENCOUNTER — Encounter: Payer: Self-pay | Admitting: Medical-Surgical

## 2021-02-26 NOTE — Progress Notes (Deleted)
  Subjective:    CC:   HPI:   I reviewed the past medical history, family history, social history, surgical history, and allergies today and no changes were needed.  Please see the problem list section below in epic for further details.  Past Medical History: Past Medical History:  Diagnosis Date  . Generalized pruritus 02/03/2012  . Hyperlipidemia   . Hypertension 03/07/2012  . Itching 07/04/2012  . OCD (obsessive compulsive disorder) 03/23/2012  . Overactive bladder   . Seborrhea capitis 02/03/2012   Past Surgical History: Past Surgical History:  Procedure Laterality Date  . GASTRIC BYPASS    . VASECTOMY     Social History: Social History   Socioeconomic History  . Marital status: Married    Spouse name: Not on file  . Number of children: Not on file  . Years of education: Not on file  . Highest education level: Not on file  Occupational History  . Not on file  Tobacco Use  . Smoking status: Never Smoker  . Smokeless tobacco: Never Used  Vaping Use  . Vaping Use: Never used  Substance and Sexual Activity  . Alcohol use: Yes    Comment: Rarely  . Drug use: Never  . Sexual activity: Not Currently    Partners: Female    Birth control/protection: Abstinence  Other Topics Concern  . Not on file  Social History Narrative  . Not on file   Social Determinants of Health   Financial Resource Strain: Not on file  Food Insecurity: Not on file  Transportation Needs: Not on file  Physical Activity: Not on file  Stress: Not on file  Social Connections: Not on file   Family History: Family History  Problem Relation Age of Onset  . Breast cancer Mother    Allergies: No Known Allergies Medications: See med rec.  Review of Systems: See HPI for pertinent positives and negatives.   Objective:    General: Well Developed, well nourished, and in no acute distress.  Neuro: Alert and oriented x3, extra-ocular muscles intact, sensation grossly intact.  HEENT:  Normocephalic, atraumatic, pupils equal round reactive to light, neck supple, no masses, no lymphadenopathy, thyroid nonpalpable.  Skin: Warm and dry, no rashes. Cardiac: Regular rate and rhythm, no murmurs rubs or gallops, no lower extremity edema.  Respiratory: Clear to auscultation bilaterally. Not using accessory muscles, speaking in full sentences.   Impression and Recommendations:    No problem-specific Assessment & Plan notes found for this encounter.   No follow-ups on file.  ___________________________________________ Clearnce Sorrel, DNP, APRN, FNP-BC Primary Care and [place]

## 2021-02-27 ENCOUNTER — Telehealth: Payer: Self-pay | Admitting: General Practice

## 2021-02-27 ENCOUNTER — Ambulatory Visit: Payer: Medicare Other | Admitting: Medical-Surgical

## 2021-02-27 DIAGNOSIS — T839XXA Unspecified complication of genitourinary prosthetic device, implant and graft, initial encounter: Secondary | ICD-10-CM

## 2021-02-27 NOTE — Telephone Encounter (Signed)
Transition Care Management Follow-up Telephone Call  Date of discharge and from where: [facility] 02/25/21  How have you been since you were released from the hospital? Doing ok  Any questions or concerns? No  Items Reviewed:  Did the pt receive and understand the discharge instructions provided? Yes   Medications obtained and verified? Yes   Other? No   Any new allergies since your discharge? Yes   Dietary orders reviewed? Yes  Do you have support at home? Yes   Home Care and Equipment/Supplies: Were home health services ordered? no   Functional Questionnaire: (I = Independent and D = Dependent) ADLs: I  Bathing/Dressing- I  Meal Prep- I  Eating- I  Maintaining continence- I  Transferring/Ambulation- I  Managing Meds- I  Follow up appointments reviewed:   PCP Hospital f/u appt confirmed? No  Patient is referred to see the urologist at this time.   [facility] f/u appt confirmed? Yes  Scheduled to see [facility] on 03/02/21 @ 1:45pm.  Are transportation arrangements needed? No   If their condition worsens, is the pt aware to call PCP or go to the Emergency Dept.? Yes  Was the patient provided with contact information for the PCP's office or ED? Yes  Was to pt encouraged to call back with questions or concerns? Yes

## 2021-04-24 ENCOUNTER — Other Ambulatory Visit: Payer: Self-pay | Admitting: Medical-Surgical

## 2021-05-11 ENCOUNTER — Other Ambulatory Visit: Payer: Self-pay

## 2021-05-11 ENCOUNTER — Ambulatory Visit (INDEPENDENT_AMBULATORY_CARE_PROVIDER_SITE_OTHER): Payer: Medicare Other | Admitting: Medical-Surgical

## 2021-05-11 ENCOUNTER — Encounter: Payer: Self-pay | Admitting: Medical-Surgical

## 2021-05-11 VITALS — BP 115/78 | HR 93 | Resp 20 | Ht 68.5 in | Wt 296.0 lb

## 2021-05-11 DIAGNOSIS — I10 Essential (primary) hypertension: Secondary | ICD-10-CM | POA: Diagnosis not present

## 2021-05-11 DIAGNOSIS — R058 Other specified cough: Secondary | ICD-10-CM | POA: Diagnosis not present

## 2021-05-11 DIAGNOSIS — R39198 Other difficulties with micturition: Secondary | ICD-10-CM | POA: Diagnosis not present

## 2021-05-11 DIAGNOSIS — N182 Chronic kidney disease, stage 2 (mild): Secondary | ICD-10-CM | POA: Diagnosis not present

## 2021-05-11 DIAGNOSIS — H0259 Other disorders affecting eyelid function: Secondary | ICD-10-CM

## 2021-05-11 DIAGNOSIS — T464X5A Adverse effect of angiotensin-converting-enzyme inhibitors, initial encounter: Secondary | ICD-10-CM

## 2021-05-11 MED ORDER — LOSARTAN POTASSIUM 25 MG PO TABS: 25.0000 mg | ORAL_TABLET | Freq: Every day | ORAL | 1 refills | Status: DC

## 2021-05-11 MED ORDER — CELECOXIB 200 MG PO CAPS: ORAL_CAPSULE | ORAL | 2 refills | Status: AC

## 2021-05-11 NOTE — Patient Instructions (Signed)
Stop Lisinopril..... Start Losartan Stop Ibuprofen...Marland KitchenMarland KitchenMarland Kitchen Start Celebrex See your eye doctor for an evaluation of your blinking  See Urology regarding your urinary stream deflection and premature ejaculation.

## 2021-05-11 NOTE — Progress Notes (Signed)
  HPI with pertinent ROS:   CC: Chronic follow-up  HPI: Pleasant 70 year old male presenting today for the following:  Since his last visit here, he has had his Foley catheter removed.  He had the catheter for about 5 months and has been doing fairly well since he had it removed.  His biggest complaint is that his urine stream is misdirected to the left.  He has had a couple of urinals that he tried but they were not helpful.  He is now using a graduated cylinder to catch his urine when he is not home.  He is able to sit down on the toilet and urinate fine at home.  Also notes that he has had issues with premature ejaculation since he had his Foley catheter removed.  He plans to see urology next week to see if they have some suggestions for fixing this.  He does still continue to have excessive blinking on the left side.  He equates this to worsening of cervical DDD as well as other arthritis.  He does have a history of OCD but is currently untreated.  Has not had an eye exam in a number of years.  Blood pressure looks good today but he is reporting a dry hacking cough that he equates to "a cat with a hairball".  He was advised by his pharmacist that this is likely related to lisinopril and he would like to switch that medicine for something else.  He continues to take amlodipine 10 mg daily without side effects and tolerates it very well.  I reviewed the past medical history, family history, social history, surgical history, and allergies today and no changes were needed.  Please see the problem list section below in epic for further details.  Physical exam:   General: Well Developed, well nourished, and in no acute distress.  Neuro: Alert and oriented x3.  HEENT: Normocephalic, atraumatic.  Skin: Warm and dry. Cardiac: Regular rate and rhythm, no murmurs rubs or gallops, no lower extremity edema.  Respiratory: Clear to auscultation bilaterally. Not using accessory muscles, speaking in full  sentences.  Impression and Recommendations:    1. ACE-inhibitor cough 2. Primary hypertension Blood pressure at goal today.  Discontinue lisinopril.  Switching to losartan 25 mg daily.  3. CKD (chronic kidney disease) stage 2, GFR 60-89 ml/min Reviewed recent labs, kidney function appears to be stable.  4. Abnormal urinary stream Advised to contact urology regarding his deflected urinary stream.  This could be a side effect of a long-term Foley catheter.  On exam, no specific abnormalities to indicate a cause.  5.  Excessive blinking He does seem to feel that this is related to his arthritis and cervical degeneration.  Switching from ibuprofen 800 mg 3 times daily to Celebrex 200 mg twice daily.  Advised patient to have an eye doctor do a full eye exam as dry eye and other issues may cause in excess and blinking.   Return in about 2 weeks (around 05/25/2021) for nurse visit for BP check. ___________________________________________ Kevin Sorrel, DNP, APRN, FNP-BC Primary Care and [place]

## 2021-05-12 ENCOUNTER — Other Ambulatory Visit: Payer: Self-pay | Admitting: Medical-Surgical

## 2021-05-12 ENCOUNTER — Encounter: Payer: Self-pay | Admitting: Medical-Surgical

## 2021-05-12 MED ORDER — IPRATROPIUM BROMIDE 0.03 % NA SOLN: 2.0000 | Freq: Two times a day (BID) | NASAL | 0 refills | Status: DC

## 2021-05-14 ENCOUNTER — Other Ambulatory Visit: Payer: Self-pay

## 2021-05-14 DIAGNOSIS — R0989 Other specified symptoms and signs involving the circulatory and respiratory systems: Secondary | ICD-10-CM

## 2021-05-14 MED ORDER — IPRATROPIUM BROMIDE 0.03 % NA SOLN: 2.0000 | Freq: Two times a day (BID) | NASAL | 0 refills | Status: DC

## 2021-05-25 ENCOUNTER — Ambulatory Visit (INDEPENDENT_AMBULATORY_CARE_PROVIDER_SITE_OTHER): Payer: Medicare Other | Admitting: Medical-Surgical

## 2021-05-25 ENCOUNTER — Other Ambulatory Visit: Payer: Self-pay

## 2021-05-25 VITALS — BP 125/82 | HR 74

## 2021-05-25 DIAGNOSIS — R058 Other specified cough: Secondary | ICD-10-CM

## 2021-05-25 DIAGNOSIS — I10 Essential (primary) hypertension: Secondary | ICD-10-CM

## 2021-05-25 DIAGNOSIS — T464X5A Adverse effect of angiotensin-converting-enzyme inhibitors, initial encounter: Secondary | ICD-10-CM

## 2021-05-25 NOTE — Progress Notes (Signed)
Patient presents today as a nurse visit for a blood pressure check.  Patient states he  is taking his medication as prescribed without any side effects/adverse effects. Medication and allergy list reviewed with patient and the pharmacy has been verified.   HA: No Dizziness/lightheadedness: No Fever: No BA: No Weakness/Fatigue: No  Sinus pain/pressure: No  Runny nose: No  ST: No  ShOB: No  CP: No  Palps: No Abd pain: No Dysuria: No  N/V/C/D: No    Vital Signs at 2:53 PM Blood Pressure: 125 82 Pulse: 74 SpO2: 99%     This information was shared with Samuel Bouche, FNP who instructed me to tell the pt to continue his current regimen as prescribed and to schedule a f/up OV with her in 3 months. Pt aware and verbalized understanding.   Pt is always wanting to have his cholesterol checked. Per Joy, labs will be done when he comes in for his f/up OV in 3 months.   No further questions or concerns at this time.

## 2021-05-27 ENCOUNTER — Ambulatory Visit (INDEPENDENT_AMBULATORY_CARE_PROVIDER_SITE_OTHER): Payer: Medicare Other | Admitting: Physical Therapy

## 2021-05-27 ENCOUNTER — Other Ambulatory Visit: Payer: Self-pay | Admitting: Medical-Surgical

## 2021-05-27 ENCOUNTER — Other Ambulatory Visit: Payer: Self-pay

## 2021-05-27 ENCOUNTER — Encounter: Payer: Self-pay | Admitting: Physical Therapy

## 2021-05-27 DIAGNOSIS — R293 Abnormal posture: Secondary | ICD-10-CM

## 2021-05-27 DIAGNOSIS — M6281 Muscle weakness (generalized): Secondary | ICD-10-CM | POA: Diagnosis present

## 2021-05-27 DIAGNOSIS — R29898 Other symptoms and signs involving the musculoskeletal system: Secondary | ICD-10-CM | POA: Diagnosis not present

## 2021-05-27 DIAGNOSIS — M5030 Other cervical disc degeneration, unspecified cervical region: Secondary | ICD-10-CM

## 2021-05-27 NOTE — Patient Instructions (Signed)
Access Code: [email or web address] URL: [email or web address] Date: 05/27/2021 Prepared by: Isabelle Course  Exercises Seated Scapular Retraction - 1 x daily - 7 x weekly - 2 sets - 10 reps Seated Thoracic Extension with Hands Behind Neck - 1 x daily - 7 x weekly - 1 sets - 10 reps Standing Backward Shoulder Rolls - 1 x daily - 7 x weekly - 2 sets - 10 reps Standing Cervical Retraction - 1 x daily - 7 x weekly - 1 sets - 10 reps - 3-5 seconds hold Supine Chin Tuck - 1 x daily - 7 x weekly - 1 sets - 10 reps - 3-5 seconds hold

## 2021-05-27 NOTE — Therapy (Signed)
[facility] [place] [address] [place], [place], [address] Phone: [phone]   Fax:  [phone]  Physical Therapy Evaluation  Patient Details  Name: Kevin Stuart MRN: [id number] Date of Birth: 1951/09/24 Referring Provider (PT): Christen Butter   Encounter Date: 05/27/2021   PT End of Session - 05/27/21 1316     Visit Number 1    Number of Visits 12    Date for PT Re-Evaluation 07/08/21    PT Start Time 1055    PT Stop Time 1145    PT Time Calculation (min) 50 min    Activity Tolerance Patient tolerated treatment well    Behavior During Therapy [facility] for tasks assessed/performed             Past Medical History:  Diagnosis Date   Generalized pruritus 02/03/2012   Hyperlipidemia    Hypertension 03/07/2012   Itching 07/04/2012   OCD (obsessive compulsive disorder) 03/23/2012   Overactive bladder    Seborrhea capitis 02/03/2012    Past Surgical History:  Procedure Laterality Date   GASTRIC BYPASS     VASECTOMY      There were no vitals filed for this visit.    Subjective Assessment - 05/27/21 1056     Subjective A few months ago pt woke up with Lt UE numb and he was worried he had a stroke, pt was given multiple tests and MD thinks numbness may be caused by nerve compression. Pt states he has rheumatoid arthritis and he has had episodes of excessive blinking of Lt eye which MD things may be caused by rheumatoid arthritis or disc disease.    Pertinent History rheumatoid arthritis    Diagnostic tests foraminal narrowing C4-C7    Patient Stated Goals reduce excessive blinking and prevent recurrence of tingling    Currently in Pain? No/denies                [facility] PT Assessment - 05/27/21 0001       Assessment   Medical Diagnosis DDD-cervical    Referring Provider (PT) Joy Jessup    Hand Dominance Right    Next MD Visit 08/25/21      Balance Screen   Has the patient fallen in the past 6 months No      Prior  Function   Level of Independence Independent      Posture/Postural Control   Posture/Postural Control Postural limitations    Postural Limitations Forward head;Rounded Shoulders      ROM / Strength   AROM / PROM / Strength AROM;Strength      AROM   AROM Assessment Site Cervical    Cervical Flexion 45    Cervical Extension 20    Cervical - Right Side Bend 35    Cervical - Left Side Bend 30    Cervical - Right Rotation 30    Cervical - Left Rotation 42      Strength   Overall Strength Comments bilat UE strength grossly 4-/5      Palpation   Spinal mobility hypomobile Lt greater than Rt cervical and upper thoraci mobs    Palpation comment fatty deposit Rt c spine, TTP suboccipitals bilat                        Objective measurements completed on examination: See above findings.       [facility] Adult PT Treatment/Exercise - 05/27/21 0001       Exercises  Exercises Neck      Neck Exercises: Standing   Neck Retraction 5 reps      Neck Exercises: Seated   Shoulder Rolls Backwards;10 reps    Shoulder Rolls Limitations cues for coordination    Other Seated Exercise scapular squeeze x 10 with cues for form      Neck Exercises: Supine   Neck Retraction 5 reps      Neck Exercises: Stretches   Other Neck Stretches thoracic extension seated x 3      Modalities   Modalities Traction      Traction   Type of Traction Cervical    Min (lbs) 10    Max (lbs) 18    Hold Time 60    Rest Time 20    Time 10 min                    PT Education - 05/27/21 1315     Education Details PT POC and goals, HEP    Person(s) Educated Patient    Methods Explanation;Demonstration;Handout    Comprehension Returned demonstration;Verbalized understanding                 PT Long Term Goals - 05/27/21 1320       PT LONG TERM GOAL #1   Title Pt will be independent with HEP    Time 6    Period Weeks    Status New    Target Date 07/08/21      PT LONG  TERM GOAL #2   Title Pt will improve cervical rotation ROM by 15 degrees bilat    Time 6    Period Weeks    Status New    Target Date 07/08/21      PT LONG TERM GOAL #3   Title Pt will demo improved posture with HEP and functional activities    Time 6    Period Weeks    Status New    Target Date 07/08/21                    Plan - 05/27/21 1317     Clinical Impression Statement Pt is a 70 y/o male referred for cervical DDD. Pt with no c/o neck pain but with c/o excessive blinking in Lt eye. Pt presents with impaired posture, decreased cervical ROM, decreased strength and will benefit from skilled PT to address deficits and improve mobility.    Personal Factors and Comorbidities Comorbidity 3+;Past/Current Experience;Fitness    Stability/Clinical Decision Making Evolving/Moderate complexity    Clinical Decision Making Moderate    Rehab Potential Good    PT Frequency 2x / week    PT Duration 6 weeks    PT Treatment/Interventions Cryotherapy;Traction;Moist Heat;Electrical Stimulation;Therapeutic activities;Therapeutic exercise;Neuromuscular re-education;Patient/family education;Manual techniques;Dry needling;Passive range of motion;Taping    PT Next Visit Plan assess HEP, postural strengthening, increase cervical and upper thoracic mobility, traction if indicated    PT Home Exercise Plan [email or web address]             Patient will benefit from skilled therapeutic intervention in order to improve the following deficits and impairments:  Decreased strength, Decreased range of motion, Postural dysfunction, Hypomobility  Visit Diagnosis: Muscle weakness (generalized) - Plan: PT plan of care cert/re-cert  Abnormal posture - Plan: PT plan of care cert/re-cert  Other symptoms and signs involving the musculoskeletal system - Plan: PT plan of care cert/re-cert     Problem List Patient Active Problem List   Diagnosis  Date Noted   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   Incontinence  of feces with fecal urgency 11/11/2020   Overactive bladder 11/11/2020   BPH (benign prostatic hyperplasia) 12/13/2014   Obesity 06/15/2013   Dependent edema 06/15/2013   CKD (chronic kidney disease) stage 2, GFR 60-89 ml/min 12/11/2012   Hyperlipidemia 10/23/2012   Marital stress 10/23/2012   Itching 07/04/2012   Hypertension, essential 03/07/2012   Seborrhea capitis 02/03/2012   Obsessive-compulsive disorder 11/26/2011   Pruritus 07/19/2011   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 [place], [place], [address] Phone: [phone]   Fax:  [phone]  Name: Kevin Stuart MRN: [id number] Date of Birth: [birth year]

## 2021-05-29 ENCOUNTER — Other Ambulatory Visit: Payer: Self-pay

## 2021-05-29 ENCOUNTER — Ambulatory Visit (INDEPENDENT_AMBULATORY_CARE_PROVIDER_SITE_OTHER): Payer: Medicare Other | Admitting: Physical Therapy

## 2021-05-29 DIAGNOSIS — M6281 Muscle weakness (generalized): Secondary | ICD-10-CM | POA: Diagnosis present

## 2021-05-29 DIAGNOSIS — R29898 Other symptoms and signs involving the musculoskeletal system: Secondary | ICD-10-CM | POA: Diagnosis not present

## 2021-05-29 DIAGNOSIS — R293 Abnormal posture: Secondary | ICD-10-CM

## 2021-05-29 NOTE — Therapy (Signed)
[facility] [place] [address] [place], [place], [address] Phone: [phone]   Fax:  [phone]  Physical Therapy Treatment  Patient Details  Name: Barry Lacina MRN: [id number] Date of Birth: 1950/12/20 Referring Provider (PT): Christen Butter   Encounter Date: 05/29/2021   PT End of Session - 05/29/21 1133     Visit Number 2    Number of Visits 12    Date for PT Re-Evaluation 07/08/21    PT Start Time 1104    PT Stop Time 1145    PT Time Calculation (min) 41 min    Activity Tolerance Patient tolerated treatment well    Behavior During Therapy [facility] for tasks assessed/performed             Past Medical History:  Diagnosis Date   Generalized pruritus 02/03/2012   Hyperlipidemia    Hypertension 03/07/2012   Itching 07/04/2012   OCD (obsessive compulsive disorder) 03/23/2012   Overactive bladder    Seborrhea capitis 02/03/2012    Past Surgical History:  Procedure Laterality Date   GASTRIC BYPASS     VASECTOMY      There were no vitals filed for this visit.   Subjective Assessment - 05/29/21 1107     Subjective Pt states "I think some of the exercises might be working"    Patient Stated Goals reduce excessive blinking and prevent recurrence of tingling    Currently in Pain? No/denies                               [facility] Adult PT Treatment/Exercise - 05/29/21 0001       Neck Exercises: Seated   Neck Retraction 5 reps;5 secs    Shoulder Rolls Backwards;10 reps    Shoulder Rolls Limitations cues for coordination    Other Seated Exercise scapular squeeze x 10 with cues for form      Neck Exercises: Stretches   Other Neck Stretches seated thoracic extension x 5 with cues      Traction   Type of Traction Cervical    Min (lbs) 10    Max (lbs) 20    Hold Time 60    Rest Time 20    Time 10 min      Manual Therapy   Manual Therapy Joint mobilization;Soft tissue mobilization    Joint  Mobilization Jt mobs with movement for cervical lateral flexion and rotation bilat and cervical extension    Soft tissue mobilization STM cervical paraspinals and TPR suboccipitals                         PT Long Term Goals - 05/27/21 1320       PT LONG TERM GOAL #1   Title Pt will be independent with HEP    Time 6    Period Weeks    Status New    Target Date 07/08/21      PT LONG TERM GOAL #2   Title Pt will improve cervical rotation ROM by 15 degrees bilat    Time 6    Period Weeks    Status New    Target Date 07/08/21      PT LONG TERM GOAL #3   Title Pt will demo improved posture with HEP and functional activities    Time 6    Period Weeks    Status New    Target Date  07/08/21                   Plan - 05/29/21 1134     Clinical Impression Statement Pt continues to requrie max cuing for technique with all exercises. No exercise progression this visit due to difficulty with proper form for current program. 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   Incontinence of feces with fecal urgency 11/11/2020   Overactive bladder 11/11/2020   BPH (benign prostatic hyperplasia) 12/13/2014   Obesity 06/15/2013   Dependent edema 06/15/2013   CKD (chronic kidney disease) stage 2, GFR 60-89 ml/min 12/11/2012   Hyperlipidemia 10/23/2012   Marital stress 10/23/2012   Itching 07/04/2012   Hypertension, essential  03/07/2012   Seborrhea capitis 02/03/2012   Obsessive-compulsive disorder 11/26/2011   Pruritus 07/19/2011   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

## 2021-06-01 ENCOUNTER — Encounter: Payer: Medicare Other | Admitting: Rehabilitative and Restorative Service Providers"

## 2021-06-02 ENCOUNTER — Encounter: Payer: Medicare Other | Admitting: Physical Therapy

## 2021-06-03 ENCOUNTER — Ambulatory Visit: Payer: Medicare Other | Admitting: Rehabilitative and Restorative Service Providers"

## 2021-06-05 ENCOUNTER — Encounter: Payer: Medicare Other | Admitting: Physical Therapy

## 2021-06-05 ENCOUNTER — Telehealth: Payer: Self-pay | Admitting: Physical Therapy

## 2021-06-05 NOTE — Telephone Encounter (Signed)
Patient did not show for PT appointment at 10:15 am.  Called patient and spoke to him. He stated that he was on his way, just a few minutes away (at 10:45 am)  Informed patient that it was too late to be seen for his appointment time today.  Confirmed next upcoming appointment with him.    Kerin Perna, PTA 06/05/21 10:49 AM

## 2021-06-09 ENCOUNTER — Ambulatory Visit (INDEPENDENT_AMBULATORY_CARE_PROVIDER_SITE_OTHER): Payer: Medicare Other | Admitting: Physical Therapy

## 2021-06-09 ENCOUNTER — Other Ambulatory Visit: Payer: Self-pay

## 2021-06-09 DIAGNOSIS — R29898 Other symptoms and signs involving the musculoskeletal system: Secondary | ICD-10-CM | POA: Diagnosis not present

## 2021-06-09 DIAGNOSIS — M6281 Muscle weakness (generalized): Secondary | ICD-10-CM | POA: Diagnosis present

## 2021-06-09 DIAGNOSIS — R293 Abnormal posture: Secondary | ICD-10-CM

## 2021-06-09 NOTE — Patient Instructions (Signed)
Access Code: [email or web address] URL: [email or web address] Date: 06/09/2021 Prepared by: Isabelle Course  Exercises Seated Scapular Retraction - 1 x daily - 7 x weekly - 2 sets - 10 reps Seated Thoracic Extension with Hands Behind Neck - 1 x daily - 7 x weekly - 1 sets - 10 reps Standing Backward Shoulder Rolls - 1 x daily - 7 x weekly - 2 sets - 10 reps Standing Cervical Retraction - 1 x daily - 7 x weekly - 1 sets - 10 reps - 3-5 seconds hold Supine Chin Tuck - 1 x daily - 7 x weekly - 1 sets - 10 reps - 3-5 seconds hold Doorway Pec Stretch at 60 Elevation - 1 x daily - 7 x weekly - 3 sets - 1 reps - 20-30 sec hold Shoulder External Rotation and Scapular Retraction with Resistance - 1 x daily - 7 x weekly - 2 sets - 10 reps

## 2021-06-09 NOTE — Therapy (Signed)
[facility] [place] [address] [place], [place], [address] Phone: [phone]   Fax:  [phone]  Physical Therapy Treatment  Patient Details  Name: Kevin [facility] MRN: [id number] Date of Birth: [birth year] Referring Provider (PT): Christen Butter   Encounter Date: 06/09/2021   PT End of Session - 06/09/21 1054     Visit Number 3    Number of Visits 12    Date for PT Re-Evaluation 07/08/21    PT Start Time 1030   pt arrived late   PT Stop Time 1105    PT Time Calculation (min) 35 min    Activity Tolerance Patient tolerated treatment well    Behavior During Therapy Kevin Stuart for tasks assessed/performed             Past Medical History:  Diagnosis Date   Generalized pruritus 02/03/2012   Hyperlipidemia    Hypertension 03/07/2012   Itching 07/04/2012   OCD (obsessive compulsive disorder) 03/23/2012   Overactive bladder    Seborrhea capitis 02/03/2012    Past Surgical History:  Procedure Laterality Date   GASTRIC BYPASS     VASECTOMY      There were no vitals filed for this visit.   Subjective Assessment - 06/09/21 1033     Subjective Pt states he had some neck pain yesterday but is not sure of the onset    Patient Stated Goals reduce excessive blinking and prevent recurrence of tingling    Currently in Pain? No/denies                [facility] PT Assessment - 06/09/21 0001       Assessment   Medical Diagnosis DDD-cervical    Referring Provider (PT) Joy Jessup    Hand Dominance Right    Next MD Visit 08/25/21      AROM   Cervical - Right Rotation 45    Cervical - Left Rotation 58                           [facility] Adult PT Treatment/Exercise - 06/09/21 0001       Neck Exercises: Machines for Strengthening   UBE (Upper Arm Bike) Level 3 x 4 min alt fwd/bkwd      Neck Exercises: Seated   Neck Retraction 5 reps;5 secs   max cuing   W Back 15 reps    W Back Weights (lbs) red TB    W Back  Limitations cues for technique    Shoulder Rolls Backwards;10 reps    Shoulder Rolls Limitations cues for coordination    Other Seated Exercise scapular squeeze x 10 with cues for form      Neck Exercises: Stretches   Other Neck Stretches seated thoracic extension with hands behind head x 5, doorway stretch at 60 degrees 2 x 30      Traction   Type of Traction Cervical    Min (lbs) 10    Max (lbs) 20    Hold Time 60    Rest Time 20    Time 10 min      Manual Therapy   Soft tissue mobilization suboccipital release bilat                    PT Education - 06/09/21 1054     Education Details updated HEP    Person(s) Educated Patient    Methods Explanation;Demonstration;Handout    Comprehension Returned demonstration;Verbalized understanding  PT Long Term Goals - 05/27/21 1320       PT LONG TERM GOAL #1   Title Pt will be independent with HEP    Time 6    Period Weeks    Status New    Target Date 07/08/21      PT LONG TERM GOAL #2   Title Pt will improve cervical rotation ROM by 15 degrees bilat    Time 6    Period Weeks    Status New    Target Date 07/08/21      PT LONG TERM GOAL #3   Title Pt will demo improved posture with HEP and functional activities    Time 6    Period Weeks    Status New    Target Date 07/08/21                   Plan - 06/09/21 1055     Clinical Impression Statement Pt continues to require cuing for exercise technique. Added doorway stretch and W's to improve mobility and strength. Pt continues with good response to manual therapy and traction. 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   Incontinence of feces with fecal urgency 11/11/2020   Overactive bladder 11/11/2020   BPH (benign prostatic hyperplasia) 12/13/2014   Obesity 06/15/2013   Dependent edema 06/15/2013   CKD (chronic kidney disease) stage 2, GFR 60-89 ml/min 12/11/2012   Hyperlipidemia 10/23/2012   Marital stress 10/23/2012   Itching 07/04/2012   Hypertension, essential 03/07/2012   Seborrhea capitis 02/03/2012   Obsessive-compulsive disorder 11/26/2011   Pruritus 07/19/2011   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

## 2021-06-12 ENCOUNTER — Ambulatory Visit (INDEPENDENT_AMBULATORY_CARE_PROVIDER_SITE_OTHER): Payer: Medicare Other | Admitting: Physical Therapy

## 2021-06-12 ENCOUNTER — Other Ambulatory Visit: Payer: Self-pay

## 2021-06-12 DIAGNOSIS — M6281 Muscle weakness (generalized): Secondary | ICD-10-CM

## 2021-06-12 DIAGNOSIS — R293 Abnormal posture: Secondary | ICD-10-CM

## 2021-06-12 DIAGNOSIS — R29898 Other symptoms and signs involving the musculoskeletal system: Secondary | ICD-10-CM | POA: Diagnosis not present

## 2021-06-12 NOTE — Therapy (Signed)
[place], [place], [address] Phone: [phone]   Fax:  [phone]  Physical Therapy Treatment  Patient Details  Name: Kevin [facility] MRN: [id number] Date of Birth: 1951-06-28 Referring Provider (PT): Samuel Bouche   Encounter Date: 06/12/2021   PT End of Session - 06/12/21 1113     Visit Number 4    Number of Visits 12    Date for PT Re-Evaluation 07/08/21    PT Start Time 1110   pt arrived late   PT Stop Time 1145    PT Time Calculation (min) 35 min    Activity Tolerance Patient tolerated treatment well    Behavior During Therapy [facility] for tasks assessed/performed             Past Medical History:  Diagnosis Date   Generalized pruritus 02/03/2012   Hyperlipidemia    Hypertension 03/07/2012   Itching 07/04/2012   OCD (obsessive compulsive disorder) 03/23/2012   Overactive bladder    Seborrhea capitis 02/03/2012    Past Surgical History:  Procedure Laterality Date   GASTRIC BYPASS     VASECTOMY      There were no vitals filed for this visit.   Subjective Assessment - 06/12/21 1111     Subjective Pt reports that therapy is helping.  Blinking hasn't changed much.    Patient Stated Goals reduce excessive blinking and prevent recurrence of tingling    Currently in Pain? No/denies                [facility] PT Assessment - 06/12/21 0001       Assessment   Medical Diagnosis DDD-cervical    Referring Provider (PT) Joy Jessup    Hand Dominance Right    Next MD Visit 08/25/21               [facility] Adult PT Treatment/Exercise - 06/12/21 0001       Neck Exercises: Standing   Neck Retraction 5 reps;3 secs    Other Standing Exercises bilat shoulder ER with red band x 15 reps    Other Standing Exercises scap retraction with tactile cues x 5 sec x 5 reps (back against noodle), shoulder rolls x 10 back      Neck Exercises: Seated   Other Seated Exercise scapular squeeze x 5 reps with  max cues      Neck Exercises: Stretches   Upper Trapezius Stretch Right;2 reps;10 seconds    Other Neck Stretches seated thoracic ext over back of chair, with hands supporting head x 10 sec x 4 reps.    Other Neck Stretches low and middle doorway stretch x 30 sec x 2 reps each position.      Traction   Type of Traction Cervical    Min (lbs) 10    Max (lbs) 20    Hold Time 60    Rest Time 20    Time 15 min              PT Long Term Goals - 06/12/21 1140       PT LONG TERM GOAL #1   Title Pt will be independent with HEP    Time 6    Period Weeks    Status On-going      PT LONG TERM GOAL #2   Title Pt will improve cervical rotation ROM by 15 degrees bilat    Time 6    Period Weeks    Status On-going  PT LONG TERM GOAL #3   Title Pt will demo improved posture with HEP and functional activities    Time 6    Period Weeks    Status On-going                   Plan - 06/12/21 1138     Clinical Impression Statement Pt requires cues to slow speed of exercises, and to hold stretches.  He tolerated all HEP exercises well.  He demonstrated improved scap retraction in standing vs seated position.  Cues to bring head to neutral throughout session (vs Lt laterally flexed).  Plan to progress towards therapy goals.    Rehab Potential Good    PT Frequency 2x / week    PT Duration 6 weeks    PT Treatment/Interventions Cryotherapy;Traction;Moist Heat;Electrical Stimulation;Therapeutic activities;Therapeutic exercise;Neuromuscular re-education;Patient/family education;Manual techniques;Dry needling;Passive range of motion;Taping    PT Next Visit Plan postural strengthening, manual and modalities as indicated    PT Home Exercise Plan [id number]    Consulted and Agree with Plan of Care Patient             Patient will benefit from skilled therapeutic intervention in order to improve the following deficits and impairments:  Decreased strength, Decreased range of motion,  Postural dysfunction, Hypomobility  Visit Diagnosis: Muscle weakness (generalized)  Abnormal posture  Other symptoms and signs involving the musculoskeletal system     Problem List Patient Active Problem List   Diagnosis Date Noted   Hyperprolactinemia ([place]) 12/08/2020   Incontinence of feces with fecal urgency 11/11/2020   Overactive bladder 11/11/2020   BPH (benign prostatic hyperplasia) 12/13/2014   Obesity 06/15/2013   Dependent edema 06/15/2013   CKD (chronic kidney disease) stage 2, GFR 60-89 ml/min 12/11/2012   Hyperlipidemia 10/23/2012   Marital stress 10/23/2012   Itching 07/04/2012   Hypertension, essential 03/07/2012   Seborrhea capitis 02/03/2012   Obsessive-compulsive disorder 11/26/2011   Pruritus 07/19/2011   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

## 2021-06-15 ENCOUNTER — Ambulatory Visit (INDEPENDENT_AMBULATORY_CARE_PROVIDER_SITE_OTHER): Payer: Medicare Other | Admitting: Physical Therapy

## 2021-06-15 ENCOUNTER — Other Ambulatory Visit: Payer: Self-pay

## 2021-06-15 ENCOUNTER — Encounter: Payer: Self-pay | Admitting: Physical Therapy

## 2021-06-15 DIAGNOSIS — M6281 Muscle weakness (generalized): Secondary | ICD-10-CM | POA: Diagnosis present

## 2021-06-15 DIAGNOSIS — R29898 Other symptoms and signs involving the musculoskeletal system: Secondary | ICD-10-CM | POA: Diagnosis not present

## 2021-06-15 DIAGNOSIS — R293 Abnormal posture: Secondary | ICD-10-CM

## 2021-06-15 NOTE — Therapy (Signed)
[place], [place], [address] Phone: [phone]   Fax:  [phone]  Physical Therapy Treatment  Patient Details  Name: Kevin [facility] MRN: [id number] Date of Birth: [birth year] Referring Provider (PT): Samuel Bouche   Encounter Date: 06/15/2021   PT End of Session - 06/15/21 1101     Visit Number 5    Number of Visits 12    Date for PT Re-Evaluation 07/08/21    PT Start Time 1058    PT Stop Time 1136    PT Time Calculation (min) 38 min    Activity Tolerance Patient tolerated treatment well    Behavior During Therapy [facility] for tasks assessed/performed             Past Medical History:  Diagnosis Date   Generalized pruritus 02/03/2012   Hyperlipidemia    Hypertension 03/07/2012   Itching 07/04/2012   OCD (obsessive compulsive disorder) 03/23/2012   Overactive bladder    Seborrhea capitis 02/03/2012    Past Surgical History:  Procedure Laterality Date   GASTRIC BYPASS     VASECTOMY      There were no vitals filed for this visit.   Subjective Assessment - 06/15/21 1101     Subjective Pt reports he was uncomfortable during band exercises, but felt better afterwards.  Yesterday when he was doing dishes, his "Lt eye just began blinking like crazy", eventually went away with medicine.    Pertinent History rheumatoid arthritis    Diagnostic tests foraminal narrowing C4-C7    Patient Stated Goals reduce excessive blinking and prevent recurrence of tingling    Currently in Pain? No/denies                [facility] PT Assessment - 06/15/21 0001       Assessment   Medical Diagnosis DDD-cervical    Referring Provider (PT) Joy Jessup    Hand Dominance Right    Next MD Visit 08/25/21      AROM   Cervical - Right Rotation 45    Cervical - Left Rotation 45              [facility] Adult PT Treatment/Exercise - 06/15/21 0001       Neck Exercises: Machines for Strengthening   UBE (Upper Arm  Bike) L2: 1.5 min forward / 1.5 backward      Neck Exercises: Standing   Other Standing Exercises bilat shoulder ER with red band x 15 reps    Other Standing Exercises bilat row with 3 sec pause in retraction x 10, bilat shoulder ext x10, with green band.      Neck Exercises: Supine   Neck Retraction 5 reps;5 secs    Cervical Rotation Right;5 reps      Neck Exercises: Stretches   Other Neck Stretches snow angels in hooklying x 8    Other Neck Stretches low and middle doorway stretch x 30 sec x 2 reps each position.  difficulty tolerating      Traction   Type of Traction Cervical    Min (lbs) 10    Max (lbs) 20    Hold Time 60    Rest Time 20    Time 3   cues to not lift head; reset traction 3x              PT Long Term Goals - 06/12/21 1140       PT LONG TERM GOAL #1   Title Pt will  be independent with HEP    Time 6    Period Weeks    Status On-going      PT LONG TERM GOAL #2   Title Pt will improve cervical rotation ROM by 15 degrees bilat    Time 6    Period Weeks    Status On-going      PT LONG TERM GOAL #3   Title Pt will demo improved posture with HEP and functional activities    Time 6    Period Weeks    Status On-going                   Plan - 06/15/21 1136     Clinical Impression Statement Cues throughout for form and to slow speed of exercises.  Limited tolerance for doorway stretch at 90 deg abdct. Cervical rotation remains limited.  Pt frequently lifts head when in cervical traction, making unit slide up to uncomfortable position on head. After resetting head into cradle 3x, pt tolerated traction 3 min. Pt requested to stop traction and "try again next time".  Goals are ongoing.    Rehab Potential Good    PT Frequency 2x / week    PT Duration 6 weeks    PT Treatment/Interventions Cryotherapy;Traction;Moist Heat;Electrical Stimulation;Therapeutic activities;Therapeutic exercise;Neuromuscular re-education;Patient/family education;Manual  techniques;Dry needling;Passive range of motion;Taping    PT Next Visit Plan postural strengthening, manual and modalities as indicated    PT Home Exercise Plan [id number]    Consulted and Agree with Plan of Care Patient             Patient will benefit from skilled therapeutic intervention in order to improve the following deficits and impairments:  Decreased strength, Decreased range of motion, Postural dysfunction, Hypomobility  Visit Diagnosis: Muscle weakness (generalized)  Abnormal posture  Other symptoms and signs involving the musculoskeletal system     Problem List Patient Active Problem List   Diagnosis Date Noted   Hyperprolactinemia ([place]) 12/08/2020   Incontinence of feces with fecal urgency 11/11/2020   Overactive bladder 11/11/2020   BPH (benign prostatic hyperplasia) 12/13/2014   Obesity 06/15/2013   Dependent edema 06/15/2013   CKD (chronic kidney disease) stage 2, GFR 60-89 ml/min 12/11/2012   Hyperlipidemia 10/23/2012   Marital stress 10/23/2012   Itching 07/04/2012   Hypertension, essential 03/07/2012   Seborrhea capitis 02/03/2012   Obsessive-compulsive disorder 11/26/2011   Pruritus 07/19/2011   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

## 2021-06-17 ENCOUNTER — Ambulatory Visit (INDEPENDENT_AMBULATORY_CARE_PROVIDER_SITE_OTHER): Payer: Medicare Other | Admitting: Physical Therapy

## 2021-06-17 ENCOUNTER — Other Ambulatory Visit: Payer: Self-pay

## 2021-06-17 ENCOUNTER — Encounter: Payer: Self-pay | Admitting: Physical Therapy

## 2021-06-17 DIAGNOSIS — R293 Abnormal posture: Secondary | ICD-10-CM | POA: Diagnosis not present

## 2021-06-17 DIAGNOSIS — R29898 Other symptoms and signs involving the musculoskeletal system: Secondary | ICD-10-CM | POA: Diagnosis not present

## 2021-06-17 DIAGNOSIS — M6281 Muscle weakness (generalized): Secondary | ICD-10-CM

## 2021-06-17 NOTE — Therapy (Signed)
[place], [place], [address] Phone: [phone]   Fax:  [phone]  Physical Therapy Treatment  Patient Details  Name: Kevin [facility] MRN: [id number] Date of Birth: 03/13/1951 Referring Provider (PT): Samuel Bouche   Encounter Date: 06/17/2021   PT End of Session - 06/17/21 1542     Visit Number 6    Number of Visits 12    Date for PT Re-Evaluation 07/08/21    PT Start Time 1540   pt arrived late   PT Stop Time 1558    PT Time Calculation (min) 18 min    Activity Tolerance Patient tolerated treatment well    Behavior During Therapy [facility] for tasks assessed/performed             Past Medical History:  Diagnosis Date   Generalized pruritus 02/03/2012   Hyperlipidemia    Hypertension 03/07/2012   Itching 07/04/2012   OCD (obsessive compulsive disorder) 03/23/2012   Overactive bladder    Seborrhea capitis 02/03/2012    Past Surgical History:  Procedure Laterality Date   GASTRIC BYPASS     VASECTOMY      There were no vitals filed for this visit.   Subjective Assessment - 06/17/21 1542     Subjective Pt reports that the traction was uncomfortable the last visit.  Pt reports that his Lt eye is blinking more today, but he can't take any more medicine yet.    Pertinent History rheumatoid arthritis    Diagnostic tests foraminal narrowing C4-C7    Patient Stated Goals reduce excessive blinking and prevent recurrence of tingling    Currently in Pain? No/denies                [facility] PT Assessment - 06/17/21 0001       Assessment   Medical Diagnosis DDD-cervical    Referring Provider (PT) Samuel Bouche    Hand Dominance Right    Next MD Visit 08/25/21              [facility] Adult PT Treatment/Exercise - 06/17/21 0001       Neck Exercises: Machines for Strengthening   UBE (Upper Arm Bike) L2: 1.5 min forward / 1.5 backward, standing      Neck Exercises: Standing   Upper Extremity D2  Flexion;10 reps;Theraband    Theraband Level (UE D2) Level 1 (Yellow)    Other Standing Exercises bilat row x 20, bilat shoulder ext x10, with green band. Horiz abdct with green band x 10      Neck Exercises: Stretches   Upper Trapezius Stretch Right;Left;2 reps;10 seconds    Other Neck Stretches overhead stretch with hands above door x 10 sec x 2, shoulder rolls x 5 each direction   Other Neck Stretches middle doorway stretch x 15 sec x 2      Manual Therapy   Manual Therapy Manual Traction;Passive ROM    Soft tissue mobilization suboccipital release bilat    Passive ROM lateral cervical flexion    Manual Traction cervical, 20 sec holds                PT Long Term Goals - 06/12/21 1140       PT LONG TERM GOAL #1   Title Pt will be independent with HEP    Time 6    Period Weeks    Status On-going      PT LONG TERM GOAL #2   Title Pt will improve  cervical rotation ROM by 15 degrees bilat    Time 6    Period Weeks    Status On-going      PT LONG TERM GOAL #3   Title Pt will demo improved posture with HEP and functional activities    Time 6    Period Weeks    Status On-going                   Plan - 06/17/21 1547     Clinical Impression Statement Pt requires visual / verbal cues for form and speed of exercise. Posture gradually improving.  He reported reduction of blinking of Lt eye after completing exercises. Traction held, as pt was not tolerating. Trial of manual traction instead; improved tolerance. Treatment time limited due to pt's late arrival.    Rehab Potential Good    PT Frequency 2x / week    PT Duration 6 weeks    PT Treatment/Interventions Cryotherapy;Traction;Moist Heat;Electrical Stimulation;Therapeutic activities;Therapeutic exercise;Neuromuscular re-education;Patient/family education;Manual techniques;Dry needling;Passive range of motion;Taping    PT Next Visit Plan postural strengthening, manual and modalities as indicated    PT Home  Exercise Plan [id number]    Consulted and Agree with Plan of Care Patient             Patient will benefit from skilled therapeutic intervention in order to improve the following deficits and impairments:  Decreased strength, Decreased range of motion, Postural dysfunction, Hypomobility  Visit Diagnosis: Muscle weakness (generalized)  Abnormal posture  Other symptoms and signs involving the musculoskeletal system     Problem List Patient Active Problem List   Diagnosis Date Noted   Hyperprolactinemia ([place]) 12/08/2020   Incontinence of feces with fecal urgency 11/11/2020   Overactive bladder 11/11/2020   BPH (benign prostatic hyperplasia) 12/13/2014   Obesity 06/15/2013   Dependent edema 06/15/2013   CKD (chronic kidney disease) stage 2, GFR 60-89 ml/min 12/11/2012   Hyperlipidemia 10/23/2012   Marital stress 10/23/2012   Itching 07/04/2012   Hypertension, essential 03/07/2012   Seborrhea capitis 02/03/2012   Obsessive-compulsive disorder 11/26/2011   Pruritus 07/19/2011   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

## 2021-06-23 ENCOUNTER — Ambulatory Visit (INDEPENDENT_AMBULATORY_CARE_PROVIDER_SITE_OTHER): Payer: Medicare Other | Admitting: Physical Therapy

## 2021-06-23 ENCOUNTER — Other Ambulatory Visit: Payer: Self-pay

## 2021-06-23 DIAGNOSIS — R293 Abnormal posture: Secondary | ICD-10-CM | POA: Diagnosis not present

## 2021-06-23 DIAGNOSIS — R29898 Other symptoms and signs involving the musculoskeletal system: Secondary | ICD-10-CM | POA: Diagnosis not present

## 2021-06-23 DIAGNOSIS — M6281 Muscle weakness (generalized): Secondary | ICD-10-CM

## 2021-06-23 NOTE — Therapy (Signed)
[facility] [place] [address] [place], [place], [address] Phone: [phone]   Fax:  [phone]  Physical Therapy Treatment  Patient Details  Name: Kevin Stuart MRN: [id number] Date of Birth: Jun 01, 1951 Referring Provider (PT): Christen Butter   Encounter Date: 06/23/2021   PT End of Session - 06/23/21 1100     Visit Number 7    Number of Visits 12    Date for PT Re-Evaluation 07/08/21    PT Start Time 1035   pt arrived late   PT Stop Time 1100    PT Time Calculation (min) 25 min    Activity Tolerance Patient tolerated treatment well    Behavior During Therapy [facility] for tasks assessed/performed             Past Medical History:  Diagnosis Date   Generalized pruritus 02/03/2012   Hyperlipidemia    Hypertension 03/07/2012   Itching 07/04/2012   OCD (obsessive compulsive disorder) 03/23/2012   Overactive bladder    Seborrhea capitis 02/03/2012    Past Surgical History:  Procedure Laterality Date   GASTRIC BYPASS     VASECTOMY      There were no vitals filed for this visit.   Subjective Assessment - 06/23/21 1036     Subjective Pt states that "everything is the same". States he is worried about his lipoma and plans to see MD soon    Diagnostic tests foraminal narrowing C4-C7    Patient Stated Goals reduce excessive blinking and prevent recurrence of tingling    Currently in Pain? No/denies                               [facility] Adult PT Treatment/Exercise - 06/23/21 0001       Neck Exercises: Machines for Strengthening   UBE (Upper Arm Bike) L3 x 3 min alt fwd/bkwd      Neck Exercises: Standing   Upper Extremity D2 Flexion;10 reps    Theraband Level (UE D2) Level 2 (Red)    Other Standing Exercises bilat row, ext both x 20 green TB      Neck Exercises: Stretches   Upper Trapezius Stretch Right;Left;2 reps;10 seconds    Other Neck Stretches overhead stretch with hands above door x 10 sec x 2     Other Neck Stretches middle doorway stretch x 15 sec x 2      Manual Therapy   Soft tissue mobilization suboccipital release bilat    Passive ROM lateral cervical flexion and rotation    Manual Traction cervical, 20 sec holds                         PT Long Term Goals - 06/12/21 1140       PT LONG TERM GOAL #1   Title Pt will be independent with HEP    Time 6    Period Weeks    Status On-going      PT LONG TERM GOAL #2   Title Pt will improve cervical rotation ROM by 15 degrees bilat    Time 6    Period Weeks    Status On-going      PT LONG TERM GOAL #3   Title Pt will demo improved posture with HEP and functional activities    Time 6    Period Weeks    Status On-going  Plan - 06/23/21 1101     Clinical Impression Statement Pt continues to require max cuing for posture and form during exercises. Pt with some improvement in posture but no reduction in Lt eye blinking. 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   Incontinence of feces with fecal urgency 11/11/2020   Overactive bladder 11/11/2020   BPH (benign prostatic hyperplasia) 12/13/2014   Obesity 06/15/2013   Dependent edema 06/15/2013   CKD (chronic kidney disease) stage 2, GFR 60-89 ml/min 12/11/2012   Hyperlipidemia 10/23/2012   Marital stress 10/23/2012   Itching 07/04/2012   Hypertension, essential 03/07/2012    Seborrhea capitis 02/03/2012   Obsessive-compulsive disorder 11/26/2011   Pruritus 07/19/2011   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 [place], [place], [address] Phone: [phone]   Fax:  [phone]  Name: Byford Furse MRN: [id number] Date of Birth: 06/19/1951

## 2021-06-26 ENCOUNTER — Other Ambulatory Visit: Payer: Self-pay

## 2021-06-26 ENCOUNTER — Ambulatory Visit (INDEPENDENT_AMBULATORY_CARE_PROVIDER_SITE_OTHER): Payer: Medicare Other | Admitting: Physical Therapy

## 2021-06-26 DIAGNOSIS — R29898 Other symptoms and signs involving the musculoskeletal system: Secondary | ICD-10-CM | POA: Diagnosis not present

## 2021-06-26 DIAGNOSIS — R293 Abnormal posture: Secondary | ICD-10-CM

## 2021-06-26 DIAGNOSIS — M6281 Muscle weakness (generalized): Secondary | ICD-10-CM | POA: Diagnosis not present

## 2021-06-26 NOTE — Therapy (Signed)
[place], [place], [address] Phone: [phone]   Fax:  [phone]  Physical Therapy Treatment  Patient Details  Name: Kevin Stuart MRN: [id number] Date of Birth: 1951/02/21 Referring Provider (PT): Samuel Bouche   Encounter Date: 06/26/2021   PT End of Session - 06/26/21 1103     Visit Number 8    Number of Visits 12    Date for PT Re-Evaluation 07/08/21    PT Start Time 1058    PT Stop Time 1137    PT Time Calculation (min) 39 min    Activity Tolerance Patient tolerated treatment well    Behavior During Therapy [facility] for tasks assessed/performed             Past Medical History:  Diagnosis Date   Generalized pruritus 02/03/2012   Hyperlipidemia    Hypertension 03/07/2012   Itching 07/04/2012   OCD (obsessive compulsive disorder) 03/23/2012   Overactive bladder    Seborrhea capitis 02/03/2012    Past Surgical History:  Procedure Laterality Date   GASTRIC BYPASS     VASECTOMY      There were no vitals filed for this visit.   Subjective Assessment - 06/26/21 1106     Subjective Pt reports he feels about the same, no big changes since starting therapy, but would like to complete course of therapy before deciding the next step.    Pertinent History rheumatoid arthritis    Diagnostic tests foraminal narrowing C4-C7    Patient Stated Goals reduce excessive blinking and prevent recurrence of tingling    Currently in Pain? No/denies                [facility] PT Assessment - 06/26/21 0001       Assessment   Medical Diagnosis DDD-cervical    Referring Provider (PT) Joy Jessup    Hand Dominance Right    Next MD Visit 08/25/21      AROM   Cervical - Right Rotation 55    Cervical - Left Rotation 62             [facility] Adult PT Treatment/Exercise - 06/26/21 0001       Neck Exercises: Machines for Strengthening   UBE (Upper Arm Bike) L3 x 3 min alt fwd/bkwd      Neck Exercises:  Standing   Upper Extremity D2 Flexion;15 reps    Theraband Level (UE D2) Level 2 (Red)    Other Standing Exercises bilat horiz abdct with red band x 10    Other Standing Exercises bilat row, ext both x 20 green TB      Neck Exercises: Sidelying   Other Sidelying Exercise open book with Lt rotation x 8 reps      Neck Exercises: Stretches   Upper Trapezius Stretch Right;Left;2 reps;10 seconds    Other Neck Stretches biltat bicep stretch x 15 sec.    Other Neck Stretches middle and high doorway stretch x 15 sec x 2      Manual Therapy   Soft tissue mobilization suboccipital release bilat, upper trap, cervical paraspinals, pecs bilat    Passive ROM lateral cervical flexion and rotation    Manual Traction cervical, 20 sec holds                          PT Long Term Goals - 06/26/21 1147       PT LONG TERM GOAL #1  Title Pt will be independent with HEP    Time 6    Period Weeks    Status On-going      PT LONG TERM GOAL #2   Title Pt will improve cervical rotation ROM by 15 degrees bilat    Time 6    Period Weeks    Status On-going      PT LONG TERM GOAL #3   Title Pt will demo improved posture with HEP and functional activities    Time 6    Period Weeks    Status On-going                   Plan - 06/26/21 1150     Clinical Impression Statement Pt demonstrated improved neck rotation ROM; has met this goal.  He tolerated resistance and stretching exercises well. Posture gradually improving.  When in supine, pt's head naturally gravitates towards Rt side.  Time spent stretching passively in both directions.    PT Frequency 2x / week    PT Duration 6 weeks    PT Treatment/Interventions Cryotherapy;Traction;Moist Heat;Electrical Stimulation;Therapeutic activities;Therapeutic exercise;Neuromuscular re-education;Patient/family education;Manual techniques;Dry needling;Passive range of motion;Taping    PT Next Visit Plan postural strengthening, manual and  modalities as indicated    PT Home Exercise Plan [id number]    Consulted and Agree with Plan of Care Patient             Patient will benefit from skilled therapeutic intervention in order to improve the following deficits and impairments:  Decreased strength, Decreased range of motion, Postural dysfunction, Hypomobility  Visit Diagnosis: Muscle weakness (generalized)  Abnormal posture  Other symptoms and signs involving the musculoskeletal system     Problem List Patient Active Problem List   Diagnosis Date Noted   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   Incontinence of feces with fecal urgency 11/11/2020   Overactive bladder 11/11/2020   BPH (benign prostatic hyperplasia) 12/13/2014   Obesity 06/15/2013   Dependent edema 06/15/2013   CKD (chronic kidney disease) stage 2, GFR 60-89 ml/min 12/11/2012   Hyperlipidemia 10/23/2012   Marital stress 10/23/2012   Itching 07/04/2012   Hypertension, essential 03/07/2012   Seborrhea capitis 02/03/2012   Obsessive-compulsive disorder 11/26/2011   Pruritus 07/19/2011   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 [place], [place], [address] Phone: [phone]   Fax:  [phone]  Name: Kais Monje MRN: [id number] Date of Birth: 1951/06/21

## 2021-06-29 ENCOUNTER — Ambulatory Visit (INDEPENDENT_AMBULATORY_CARE_PROVIDER_SITE_OTHER): Payer: Medicare Other | Admitting: Medical-Surgical

## 2021-06-29 ENCOUNTER — Encounter: Payer: Self-pay | Admitting: Medical-Surgical

## 2021-06-29 VITALS — BP 122/75 | HR 79 | Resp 20 | Ht 68.5 in | Wt 302.0 lb

## 2021-06-29 DIAGNOSIS — Q6470 Unspecified congenital malformation of bladder and urethra: Secondary | ICD-10-CM

## 2021-06-29 NOTE — Progress Notes (Signed)
  HPI with pertinent ROS:   CC: penile concern  HPI: Pleasant 70 year old male presenting today for to discuss a concern related to his urinary stream.  He had a long-term Foley for approximately 5 months and had a UroLift procedure done to correct his urinary issues.  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  When he urinates now, his urine stream is deflected and he is unable to urinate standing up.  This is bothersome to him and greatly impacts his quality of life.  He spoke to his urological surgeon but was informed that that particular provider does not perform the procedure he needs.  He is here today to request a referral to a urologist or general surgeon that will be able to repair the urethra.  I reviewed the past medical history, family history, social history, surgical history, and allergies today and no changes were needed.  Please see the problem list section below in epic for further details.   Physical exam:   General: Well Developed, well nourished, and in no acute distress.  Neuro: Alert and oriented x3.  HEENT: Normocephalic, atraumatic.  Skin: Warm and dry. Cardiac: Regular rate and rhythm.  Respiratory: Not using accessory muscles, speaking in full sentences.  Impression and Recommendations:    1. Abnormality of urethral meatus Referring to urology.  Patient would like to avoid being sent back to [facility] urology in [place] as he has been there previously.  Ultimately, he needs a urological surgeon to be able to repair the urethral meatus and hopefully rectify his urinary stream issues. - Ambulatory referral to Urology  Return if symptoms worsen or fail to improve. ___________________________________________ Clearnce Sorrel, DNP, APRN, FNP-BC [facility] and [place]

## 2021-06-30 ENCOUNTER — Other Ambulatory Visit: Payer: Self-pay

## 2021-06-30 ENCOUNTER — Encounter: Payer: Self-pay | Admitting: Physical Therapy

## 2021-06-30 ENCOUNTER — Ambulatory Visit (INDEPENDENT_AMBULATORY_CARE_PROVIDER_SITE_OTHER): Payer: Medicare Other | Admitting: Physical Therapy

## 2021-06-30 DIAGNOSIS — M6281 Muscle weakness (generalized): Secondary | ICD-10-CM

## 2021-06-30 DIAGNOSIS — R293 Abnormal posture: Secondary | ICD-10-CM | POA: Diagnosis not present

## 2021-06-30 DIAGNOSIS — R29898 Other symptoms and signs involving the musculoskeletal system: Secondary | ICD-10-CM

## 2021-06-30 NOTE — Therapy (Signed)
[place], [place], [address] Phone: [phone]   Fax:  [phone]  Physical Therapy Treatment  Patient Details  Name: Kevin Stuart MRN: [id number] Date of Birth: 06/19/1951 Referring Provider (PT): Samuel Bouche   Encounter Date: 06/30/2021   PT End of Session - 06/30/21 0947     Visit Number 9    Number of Visits 12    Date for PT Re-Evaluation 07/08/21    PT Start Time 0946   pt arrived late   PT Stop Time 1015    PT Time Calculation (min) 29 min    Activity Tolerance Patient tolerated treatment well    Behavior During Therapy [facility] for tasks assessed/performed             Past Medical History:  Diagnosis Date   Generalized pruritus 02/03/2012   Hyperlipidemia    Hypertension 03/07/2012   Itching 07/04/2012   OCD (obsessive compulsive disorder) 03/23/2012   Overactive bladder    Seborrhea capitis 02/03/2012    Past Surgical History:  Procedure Laterality Date   GASTRIC BYPASS     VASECTOMY      There were no vitals filed for this visit.   Subjective Assessment - 06/30/21 0948     Subjective Pt reports that things are going well.  No new changes since last visit.    Pertinent History rheumatoid arthritis    Diagnostic tests foraminal narrowing C4-C7    Patient Stated Goals reduce excessive blinking and prevent recurrence of tingling    Currently in Pain? No/denies                [facility] PT Assessment - 06/30/21 0001       Assessment   Medical Diagnosis DDD-cervical    Referring Provider (PT) Samuel Bouche    Hand Dominance Right    Next MD Visit 08/25/21              [facility] Adult PT Treatment/Exercise - 06/30/21 0001       Neck Exercises: Machines for Strengthening   UBE (Upper Arm Bike) L5: 2.5 min backward, 1.5 min forward. Standing.      Neck Exercises: Standing   Upper Extremity D2 Flexion;15 reps    Theraband Level (UE D2) Level 2 (Red)    Other Standing  Exercises bilat horiz abdct with red band x 10.  shoulder depression with green band x 10 each arm.    Other Standing Exercises bilat row, ext both x 20 green TB      Neck Exercises: Sidelying   Other Sidelying Exercise open book with Rt/Lt x 8 with red band. Tactile cues for technique      Neck Exercises: Stretches   Other Neck Stretches middle and high doorway stretch x 15 sec x 2      Manual Therapy   Soft tissue mobilization suboccipital release bilat, upper trap, cervical paraspinals    Passive ROM lateral cervical flexion and rotation    Manual Traction cervical, 20 sec holds              PT Long Term Goals - 06/30/21 1128       PT LONG TERM GOAL #1   Title Pt will be independent with HEP    Time 6    Period Weeks    Status On-going      PT LONG TERM GOAL #2   Title Pt will improve cervical rotation ROM by 15 degrees bilat  Time 6    Period Weeks    Status Achieved      PT LONG TERM GOAL #3   Title Pt will demo improved posture with HEP and functional activities    Time 6    Period Weeks    Status On-going                   Plan - 06/30/21 1001     Clinical Impression Statement Pt's exercise tolerance improving each visit.  He initially reported some Lt shoulder pain with doorway stretch; improved after completion of UE resistive exercises.  Pt continues to require tactile cues for form and speed of exercise.  Pt reported reduction of tightness in neck at end of session.  Posture gradually improving.    PT Frequency 2x / week    PT Duration 6 weeks    PT Treatment/Interventions Cryotherapy;Traction;Moist Heat;Electrical Stimulation;Therapeutic activities;Therapeutic exercise;Neuromuscular re-education;Patient/family education;Manual techniques;Dry needling;Passive range of motion;Taping    PT Next Visit Plan postural strengthening, manual as indicated.  initiate d/c planning.    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   Incontinence of feces with fecal urgency 11/11/2020   Overactive bladder 11/11/2020   BPH (benign prostatic hyperplasia) 12/13/2014   Obesity 06/15/2013   Dependent edema 06/15/2013   CKD (chronic kidney disease) stage 2, GFR 60-89 ml/min 12/11/2012   Hyperlipidemia 10/23/2012   Marital stress 10/23/2012   Itching 07/04/2012   Hypertension, essential 03/07/2012   Seborrhea capitis 02/03/2012   Obsessive-compulsive disorder 11/26/2011   Pruritus 07/19/2011   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

## 2021-07-03 ENCOUNTER — Encounter: Payer: Medicare Other | Admitting: Physical Therapy

## 2021-07-07 ENCOUNTER — Telehealth: Payer: Self-pay | Admitting: Medical-Surgical

## 2021-07-07 ENCOUNTER — Encounter: Payer: Self-pay | Admitting: Medical-Surgical

## 2021-07-07 ENCOUNTER — Ambulatory Visit (INDEPENDENT_AMBULATORY_CARE_PROVIDER_SITE_OTHER): Payer: Medicare Other | Admitting: Physical Therapy

## 2021-07-07 ENCOUNTER — Other Ambulatory Visit: Payer: Self-pay

## 2021-07-07 DIAGNOSIS — R293 Abnormal posture: Secondary | ICD-10-CM

## 2021-07-07 DIAGNOSIS — M6281 Muscle weakness (generalized): Secondary | ICD-10-CM

## 2021-07-07 DIAGNOSIS — R29898 Other symptoms and signs involving the musculoskeletal system: Secondary | ICD-10-CM

## 2021-07-07 DIAGNOSIS — R200 Anesthesia of skin: Secondary | ICD-10-CM

## 2021-07-07 DIAGNOSIS — G959 Disease of spinal cord, unspecified: Secondary | ICD-10-CM

## 2021-07-07 NOTE — Therapy (Signed)
[facility] [place] [address] [place], [place], [address] Phone: [phone]   Fax:  [phone]  Physical Therapy Treatment and Discharge  Patient Details  Name: Kevin Stuart MRN: [id number] Date of Birth: October 03, 1951 Referring Provider (PT): Christen Butter   Encounter Date: 07/07/2021   PT End of Session - 07/07/21 1106     Visit Number 10    Number of Visits 12    Date for PT Re-Evaluation 07/08/21    PT Start Time 0955   pt arrived late   PT Stop Time 1015    PT Time Calculation (min) 20 min    Activity Tolerance Patient tolerated treatment well    Behavior During Therapy [facility] for tasks assessed/performed             Past Medical History:  Diagnosis Date   Generalized pruritus 02/03/2012   Hyperlipidemia    Hypertension 03/07/2012   Itching 07/04/2012   OCD (obsessive compulsive disorder) 03/23/2012   Overactive bladder    Seborrhea capitis 02/03/2012    Past Surgical History:  Procedure Laterality Date   GASTRIC BYPASS     VASECTOMY      There were no vitals filed for this visit.       [facility] PT Assessment - 07/07/21 0001       Assessment   Medical Diagnosis DDD-cervical    Referring Provider (PT) Joy Jessup    Hand Dominance Right    Next MD Visit 08/25/21      AROM   Cervical Extension 25    Cervical - Right Side Bend 35    Cervical - Left Side Bend 40    Cervical - Right Rotation 55    Cervical - Left Rotation 62                           [facility] Adult PT Treatment/Exercise - 07/07/21 0001       Neck Exercises: Machines for Strengthening   UBE (Upper Arm Bike) L5 x 3 min alt fwd/bkwd      Neck Exercises: Standing   Upper Extremity D2 Flexion;15 reps    Theraband Level (UE D2) Level 2 (Red)    Other Standing Exercises bilat row, ext both x 20 green TB      Neck Exercises: Stretches   Other Neck Stretches bilat bicep stretch x 15sec    Other Neck Stretches middle and high  doorway stretch x 15 sec x 2      Manual Therapy   Soft tissue mobilization suboccipital release bilat, upper trap, cervical paraspinals    Passive ROM lateral cervical flexion and rotation    Manual Traction cervical, 20 sec holds                          PT Long Term Goals - 07/07/21 1106       PT LONG TERM GOAL #1   Title Pt will be independent with HEP    Status Achieved      PT LONG TERM GOAL #2   Title Pt will improve cervical rotation ROM by 15 degrees bilat    Status Achieved      PT LONG TERM GOAL #3   Title Pt will demo improved posture with HEP and functional activities    Status Not Met  Plan - 07/07/21 1107     Clinical Impression Statement Pt's progress limited by him arriving late to each appointment. Pt improving cervical ROM and exercise tolerance. Continues with deficits in posture and continues with excessive blinking. Pt has reached max potential with PT services at this time. Referred back to MD    PT Next Visit Plan d/c    PT Home Exercise Plan [email or web address]    Consulted and Agree with Plan of Care Patient             Patient will benefit from skilled therapeutic intervention in order to improve the following deficits and impairments:     Visit Diagnosis: Muscle weakness (generalized)  Abnormal posture  Other symptoms and signs involving the musculoskeletal system     Problem List Patient Active Problem List   Diagnosis Date Noted   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   Incontinence of feces with fecal urgency 11/11/2020   Overactive bladder 11/11/2020   BPH (benign prostatic hyperplasia) 12/13/2014   Obesity 06/15/2013   Dependent edema 06/15/2013   CKD (chronic kidney disease) stage 2, GFR 60-89 ml/min 12/11/2012   Hyperlipidemia 10/23/2012   Marital stress 10/23/2012   Itching 07/04/2012   Hypertension, essential 03/07/2012   Seborrhea capitis 02/03/2012   Obsessive-compulsive disorder  11/26/2011   Pruritus 07/19/2011   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 Patient goals were partially met. Patient is being discharged due to being pleased with the current functional level.   Ericka Marcellus, PT 07/07/2021, 11:25 AM  [facility] [address] [place], [place], [address] Phone: [phone]   Fax:  [phone]  Name: Kevin Stuart MRN: [id number] Date of Birth: Jul 01, 1951

## 2021-07-07 NOTE — Telephone Encounter (Signed)
Patient stopped in after his last Physical Therapy session he said (he also said he was going to try to send a message as via [place]). He was requesting another MRI or CT Scan to be done on his neck, to see the progress from PT, and he wanted to continue doing PT as well. His next follow up appointment with you is 08/25/21 (F/u on Cholesterol & bp) AM

## 2021-07-10 ENCOUNTER — Encounter: Payer: Medicare Other | Admitting: Physical Therapy

## 2021-07-13 ENCOUNTER — Telehealth: Payer: Self-pay | Admitting: Medical-Surgical

## 2021-07-13 NOTE — Telephone Encounter (Signed)
Patient walked in and is requesting Samuel Bouche to place a referral to [facility] in [facility] ATT: Valarie Merino  and Fax [phone] and Ph is [phone].  Patient seen for the penis issue on 06/29/21 with Samuel Bouche

## 2021-07-14 ENCOUNTER — Other Ambulatory Visit: Payer: Self-pay

## 2021-07-14 DIAGNOSIS — R39198 Other difficulties with micturition: Secondary | ICD-10-CM

## 2021-07-15 ENCOUNTER — Other Ambulatory Visit: Payer: Self-pay

## 2021-07-15 ENCOUNTER — Ambulatory Visit (INDEPENDENT_AMBULATORY_CARE_PROVIDER_SITE_OTHER): Payer: Medicare Other | Admitting: Urology

## 2021-07-15 ENCOUNTER — Encounter: Payer: Self-pay | Admitting: Urology

## 2021-07-15 VITALS — BP 137/87 | HR 87 | Temp 98.5°F | Wt 303.4 lb

## 2021-07-15 DIAGNOSIS — Q549 Hypospadias, unspecified: Secondary | ICD-10-CM | POA: Diagnosis not present

## 2021-07-15 DIAGNOSIS — N138 Other obstructive and reflux uropathy: Secondary | ICD-10-CM

## 2021-07-15 DIAGNOSIS — N401 Enlarged prostate with lower urinary tract symptoms: Secondary | ICD-10-CM

## 2021-07-15 DIAGNOSIS — R8290 Unspecified abnormal findings in urine: Secondary | ICD-10-CM

## 2021-07-15 LAB — URINALYSIS, ROUTINE W REFLEX MICROSCOPIC
Bilirubin, UA: NEGATIVE
Glucose, UA: NEGATIVE
Ketones, UA: NEGATIVE
Nitrite, UA: POSITIVE — AB
Protein,UA: NEGATIVE
RBC, UA: NEGATIVE
Specific Gravity, UA: 1.02 (ref 1.005–1.030)
Urobilinogen, Ur: 0.2 mg/dL (ref 0.2–1.0)
pH, UA: 7 (ref 5.0–7.5)

## 2021-07-15 NOTE — Progress Notes (Signed)

## 2021-07-15 NOTE — Progress Notes (Signed)
Assessment: 1. Male hypospadias   2. BPH with obstruction/lower urinary tract symptoms   3. Abnormal urine findings     Plan: He has a mild acquired hypospadias likely secondary to chronic Foley catheter use.  He is interested in surgical correction of this.  I recommended that if he is interested in pursuing therapy this should be done by a urologist who specializes in reconstructive procedures. I provided him with contact information for Dr. Odis Luster with [facility] in [place]. Urine culture sent today Return to office prn  Chief Complaint:  Chief Complaint  Patient presents with   penile problem     History of Present Illness:  Kevin Stuart is a 70 y.o. year old male who is seen in consultation from Samuel Bouche, NP for evaluation of a "notch" at the head of his penis.  He reports a history of urinary retention requiring an indwelling Foley catheter for approximately 5 months from February to July 2022.  He previously underwent prostatic artery embolization and subsequently UroLift by Dr. Smitty Pluck in [place].  He has been voiding spontaneously following the UroLift.  He reports a change in his urine stream following the procedure and removal of the catheter.  He feels this is due to a change in his meatus.  He reports spraying of his urinary stream requiring him to either sit to void or use a urinal.  He is not having any dysuria or gross hematuria.  He has not currently taking any medications for urinary symptoms.  He requests evaluation for correction of the acquired hypospadias. He has been previously evaluated at [facility] in [place].  He was treated with tamsulosin without benefit.  He apparently underwent a prostate biopsy in January 2022 for an elevated PSA.  He subsequently developed a UTI with prostatitis resulting in urinary retention and placement of a Foley catheter. He was recently seen by Dr. Ky Barban with [facility] in [place].  Further evaluation  with cystoscopy discussed.   Past Medical History:  Past Medical History:  Diagnosis Date   Generalized pruritus 02/03/2012   Hyperlipidemia    Hypertension 03/07/2012   Itching 07/04/2012   OCD (obsessive compulsive disorder) 03/23/2012   Overactive bladder    Seborrhea capitis 02/03/2012    Past Surgical History:  Past Surgical History:  Procedure Laterality Date   GASTRIC BYPASS     VASECTOMY      Allergies:  No Known Allergies  Family History:  Family History  Problem Relation Age of Onset   Breast cancer Mother     Social History:  Social History   Tobacco Use   Smoking status: Never   Smokeless tobacco: Never  Vaping Use   Vaping Use: Never used  Substance Use Topics   Alcohol use: Yes    Comment: Rarely   Drug use: Never    Review of symptoms:  Constitutional:  Negative for unexplained weight loss, night sweats, fever, chills ENT:  Negative for nose bleeds, sinus pain, painful swallowing CV:  Negative for chest pain, shortness of breath, exercise intolerance, palpitations, loss of consciousness Resp:  Negative for cough, wheezing, shortness of breath GI:  Negative for nausea, vomiting, diarrhea, bloody stools GU:  Positives noted in HPI; otherwise negative for gross hematuria, dysuria, urinary incontinence Neuro:  Negative for seizures, poor balance, limb weakness, slurred speech Psych:  Negative for lack of energy, depression, anxiety Endocrine:  Negative for polydipsia, polyuria, symptoms of hypoglycemia (dizziness, hunger, sweating) Hematologic:  Negative for anemia, purpura, petechia, prolonged  or excessive bleeding, use of anticoagulants  Allergic:  Negative for difficulty breathing or choking as a result of exposure to anything; no shellfish allergy; no allergic response (rash/itch) to materials, foods  Physical exam: BP 137/87   Pulse 87   Temp 98.5 F (36.9 C)   Wt (!) 303 lb 6.4 oz (137.6 kg)   BMI 45.46 kg/m  GENERAL APPEARANCE:  Well  appearing, well developed, well nourished, NAD HEENT: Atraumatic, Normocephalic, oropharynx clear. NECK: Supple without lymphadenopathy or thyromegaly. LUNGS: Clear to auscultation bilaterally. HEART: Regular Rate and Rhythm without murmurs, gallops, or rubs. ABDOMEN: Soft, non-tender, No Masses. EXTREMITIES: Moves all extremities well.  Without clubbing, cyanosis, or edema. NEUROLOGIC:  Alert and oriented x 3, normal gait, CN II-XII grossly intact.  MENTAL STATUS:  Appropriate. BACK:  Non-tender to palpation.  No CVAT SKIN:  Warm, dry and intact.   GU: Penis:  circumcised Meatus: acquired hypospadias, mild Scrotum: normal, no masses Testis: normal without masses bilateral  Results: U/A:  2+ LE, nitrite +

## 2021-07-15 NOTE — Telephone Encounter (Signed)
Referral has been placed and faxed to the facility.

## 2021-07-15 NOTE — Patient Instructions (Signed)
Lenoria Chime, MD [facility] [place] [phone]

## 2021-07-18 LAB — URINE CULTURE

## 2021-07-20 ENCOUNTER — Ambulatory Visit (INDEPENDENT_AMBULATORY_CARE_PROVIDER_SITE_OTHER): Payer: Medicare Other | Admitting: Family Medicine

## 2021-07-20 ENCOUNTER — Encounter: Payer: Self-pay | Admitting: Family Medicine

## 2021-07-20 ENCOUNTER — Other Ambulatory Visit: Payer: Self-pay

## 2021-07-20 VITALS — BP 140/90 | HR 82 | Wt 301.1 lb

## 2021-07-20 DIAGNOSIS — I10 Essential (primary) hypertension: Secondary | ICD-10-CM | POA: Diagnosis not present

## 2021-07-20 DIAGNOSIS — R600 Localized edema: Secondary | ICD-10-CM | POA: Diagnosis not present

## 2021-07-20 MED ORDER — NITROFURANTOIN MONOHYD MACRO 100 MG PO CAPS: 100.0000 mg | ORAL_CAPSULE | Freq: Two times a day (BID) | ORAL | 0 refills | Status: AC

## 2021-07-20 MED ORDER — AMLODIPINE BESYLATE 10 MG PO TABS: 5.0000 mg | ORAL_TABLET | Freq: Every day | ORAL | 1 refills | Status: DC

## 2021-07-20 MED ORDER — HYDROCHLOROTHIAZIDE 12.5 MG PO TABS: 12.5000 mg | ORAL_TABLET | Freq: Every day | ORAL | 3 refills | Status: AC

## 2021-07-20 NOTE — Addendum Note (Signed)
Addended by: Primus Bravo on: 07/20/2021 12:09 PM   Modules accepted: Orders

## 2021-07-20 NOTE — Patient Instructions (Signed)
For blood pressure and swelling - 2 week trial: Cut Amlodipine in half (5 mg daily) Start Hydrochlorothiazide 12.5 mg daily (take in the morning, this will make you pee) Continue Losartan as prescribed Compression socks during the day Elevate legs Reduce sodium intake Blood work today - we will let you know results via MyChart Monitor blood pressure at home and bring log to next appointment

## 2021-07-20 NOTE — Progress Notes (Signed)
Acute Office Visit  Subjective:    Patient ID: Kevin Stuart, male    DOB: [birth year], 70 y.o.   MRN: [id number]  Chief Complaint  Patient presents with   Foot Swelling    HPI Patient is in today for edema.  Patient reports worsening bilateral lower extremity edema for the past 1/5 weeks or so. States they feel more tight/sore/weak than normal. Denies any weeping, redness, wounds, rashes, numbness/tingling. Reports he stopped taking his losartan and celebrex about 4-5 days ago because he googled and read that they should not be taken together. States he tries to avoid sodium in his diet, but does not use compression socks or elevate his legs as often as he should.   He does have history of chronic LE edema thought to be related to his amlodipine, and he has deferred diuretics in the past. He denies any chest pain, shortness of breath, cough, abdominal distention.    Past Medical History:  Diagnosis Date   Generalized pruritus 02/03/2012   Hyperlipidemia    Hypertension 03/07/2012   Itching 07/04/2012   OCD (obsessive compulsive disorder) 03/23/2012   Overactive bladder    Seborrhea capitis 02/03/2012    Past Surgical History:  Procedure Laterality Date   GASTRIC BYPASS     VASECTOMY      Family History  Problem Relation Age of Onset   Breast cancer Mother     Social History   Socioeconomic History   Marital status: Married    Spouse name: Not on file   Number of children: Not on file   Years of education: Not on file   Highest education level: Not on file  Occupational History   Not on file  Tobacco Use   Smoking status: Never   Smokeless tobacco: Never  Vaping Use   Vaping Use: Never used  Substance and Sexual Activity   Alcohol use: Yes    Comment: Rarely   Drug use: Never   Sexual activity: Not Currently    Partners: Female    Birth control/protection: Abstinence  Other Topics Concern   Not on file  Social History Narrative   Not on file   Social  Determinants of Health   Financial Resource Strain: Not on file  Food Insecurity: Not on file  Transportation Needs: Not on file  Physical Activity: Not on file  Stress: Not on file  Social Connections: Not on file  Intimate Partner Violence: Not on file    Outpatient Medications Prior to Visit  Medication Sig Dispense Refill   celecoxib (CELEBREX) 200 MG capsule One to 2 tablets by mouth daily as needed for pain. 60 capsule 2   losartan (COZAAR) 25 MG tablet Take 25 mg by mouth daily. 12.5mg      nitrofurantoin, macrocrystal-monohydrate, (MACROBID) 100 MG capsule Take 1 capsule (100 mg total) by mouth every 12 (twelve) hours for 7 days. 14 capsule 0   pravastatin (PRAVACHOL) 40 MG tablet Take 1 tablet (40 mg total) by mouth daily. 90 tablet 1   amLODipine (NORVASC) 10 MG tablet Take 1 tablet (10 mg total) by mouth daily. 90 tablet 1   No facility-administered medications prior to visit.    No Known Allergies  Review of Systems All review of systems negative except what is listed in the HPI     Objective:    Physical Exam Vitals reviewed.  Constitutional:      Appearance: Normal appearance. He is obese.  Cardiovascular:     Rate and Rhythm:  Normal rate and regular rhythm.     Pulses: Normal pulses.     Heart sounds: Normal heart sounds.  Pulmonary:     Effort: Pulmonary effort is normal.     Breath sounds: Normal breath sounds.  Abdominal:     Palpations: Abdomen is soft.  Musculoskeletal:     Right lower leg: 2+ Edema present.     Left lower leg: 2+ Edema present.  Skin:    Findings: No erythema or rash.     Comments: Few scattered scabs to extremities  Neurological:     Mental Status: He is alert and oriented to person, place, and time.  Psychiatric:        Mood and Affect: Mood normal.        Behavior: Behavior normal.        Thought Content: Thought content normal.        Judgment: Judgment normal.    BP 140/90 (BP Location: Left Arm, Patient Position:  Sitting, Cuff Size: Large)   Pulse 82   Wt (!) 301 lb 1.9 oz (136.6 kg)   BMI 45.12 kg/m  Wt Readings from Last 3 Encounters:  07/20/21 (!) 301 lb 1.9 oz (136.6 kg)  07/15/21 (!) 303 lb 6.4 oz (137.6 kg)  06/29/21 (!) 302 lb (137 kg)    Health Maintenance Due  Topic Date Due   COLONOSCOPY (Pts 45-7yrs Insurance coverage will need to be confirmed)  07/18/2021    There are no preventive care reminders to display for this patient.   No results found for: TSH Lab Results  Component Value Date   WBC 7.7 12/08/2020   HGB 14.0 12/08/2020   HCT 42.6 12/08/2020   MCV 81.1 12/08/2020   PLT 284 12/08/2020   Lab Results  Component Value Date   NA 141 12/08/2020   K 3.9 12/08/2020   CO2 28 12/08/2020   GLUCOSE 96 12/08/2020   BUN 15 12/08/2020   CREATININE 0.84 12/08/2020   BILITOT 0.4 12/08/2020   ALKPHOS 76 02/23/2012   AST 19 12/08/2020   ALT 21 12/08/2020   PROT 7.4 12/08/2020   ALBUMIN 4.3 02/23/2012   CALCIUM 9.6 12/08/2020   Lab Results  Component Value Date   CHOL 230 (H) 10/23/2012   Lab Results  Component Value Date   HDL 49 10/23/2012   Lab Results  Component Value Date   LDLCALC 159 (H) 10/23/2012   Lab Results  Component Value Date   TRIG 112 10/23/2012   Lab Results  Component Value Date   CHOLHDL 4.7 10/23/2012   Lab Results  Component Value Date   HGBA1C 5.6 08/22/2012       Assessment & Plan:    1. Bilateral lower extremity edema 2. Primary hypertension Checking labs today as it has been awhile. BP slightly elevated, but he has missed several days of losartan. Discussed options with patient and reinforced that his amlodipine can cause BLE edema, but has been controlling his BP well - if we decrease then we need to adjust for BP in another way. Patient is agreeable to a two week trial of cutting amlodipine tabs in half (5 mg daily), starting HCTZ 12.5 mg daily, and continuing losartan as prescribed. Would like him wearing compression  socks during the day, elevating legs when able, and limiting sodium intake. He should keep a BP log over the next 2 weeks and bring to next appointment to reassess BP and edema. Will make appropriate adjustments at that time if  needed. Patient agreeable to plan. Patient aware of signs/symptoms requiring further/urgent evaluation.  - Brain natriuretic peptide - BASIC METABOLIC PANEL WITH GFR - amLODipine (NORVASC) 10 MG tablet; Take 0.5 tablets (5 mg total) by mouth daily.  Dispense: 90 tablet; Refill: 1 - hydrochlorothiazide (HYDRODIURIL) 12.5 MG tablet; Take 1 tablet (12.5 mg total) by mouth daily.  Dispense: 30 tablet; Refill: 3  Follow-up in 2 weeks for BP and edema check.  Purcell Nails Olevia Bowens, DNP, FNP-C

## 2021-07-21 LAB — BASIC METABOLIC PANEL WITH GFR
BUN: 19 mg/dL (ref 7–25)
CO2: 27 mmol/L (ref 20–32)
Calcium: 9.6 mg/dL (ref 8.6–10.3)
Chloride: 103 mmol/L (ref 98–110)
Creat: 1.2 mg/dL (ref 0.70–1.35)
Glucose, Bld: 119 mg/dL — ABNORMAL HIGH (ref 65–99)
Potassium: 3.4 mmol/L — ABNORMAL LOW (ref 3.5–5.3)
Sodium: 142 mmol/L (ref 135–146)
eGFR: 66 mL/min/{1.73_m2} (ref >=60)

## 2021-07-21 LAB — BRAIN NATRIURETIC PEPTIDE: Brain Natriuretic Peptide: 95 pg/mL (ref <100)

## 2021-07-30 ENCOUNTER — Other Ambulatory Visit: Payer: Self-pay

## 2021-07-30 ENCOUNTER — Ambulatory Visit (INDEPENDENT_AMBULATORY_CARE_PROVIDER_SITE_OTHER): Payer: Medicare Other

## 2021-07-30 ENCOUNTER — Other Ambulatory Visit: Payer: Self-pay | Admitting: Specialist

## 2021-07-30 DIAGNOSIS — D472 Monoclonal gammopathy: Secondary | ICD-10-CM | POA: Diagnosis not present

## 2021-07-30 IMAGING — DX DG BONE SURVEY MET
10 series · 10 of 10 positions shown · non-contrast
Comparison: None.

CLINICAL DATA: Monoclonal paraproteinemia.

EXAM:
METASTATIC BONE SURVEY

[skull lat]
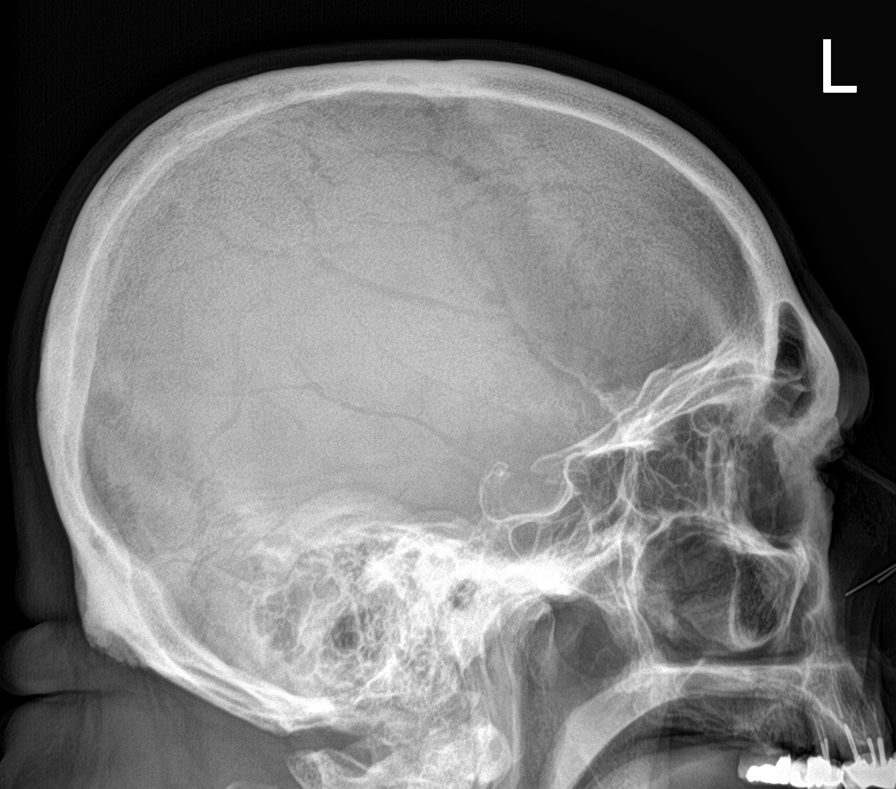

[shoulder ap (1 of 2)]
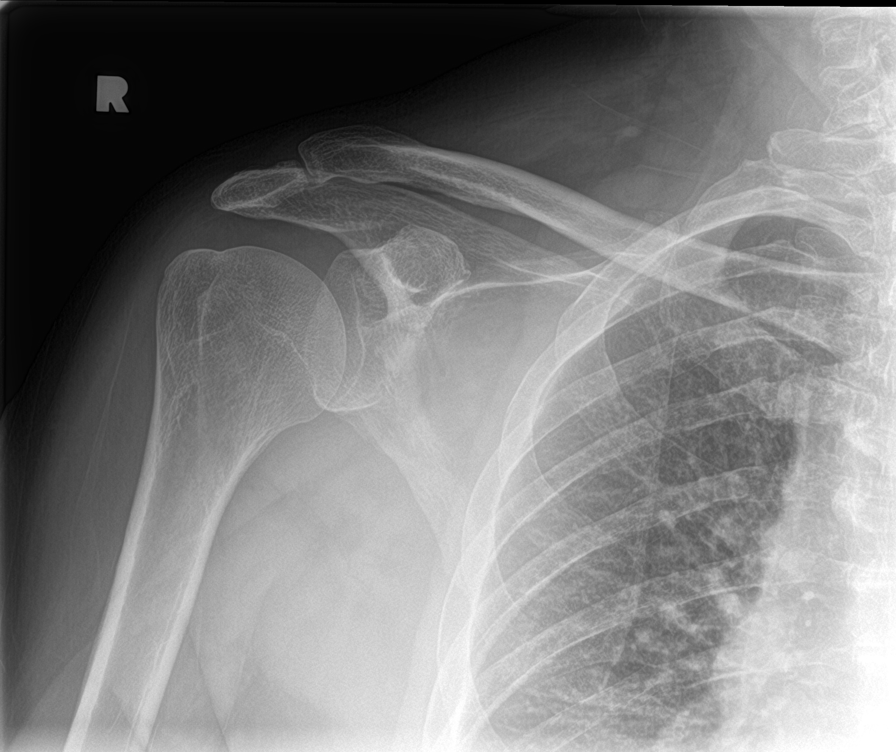

[shoulder ap (2 of 2)]
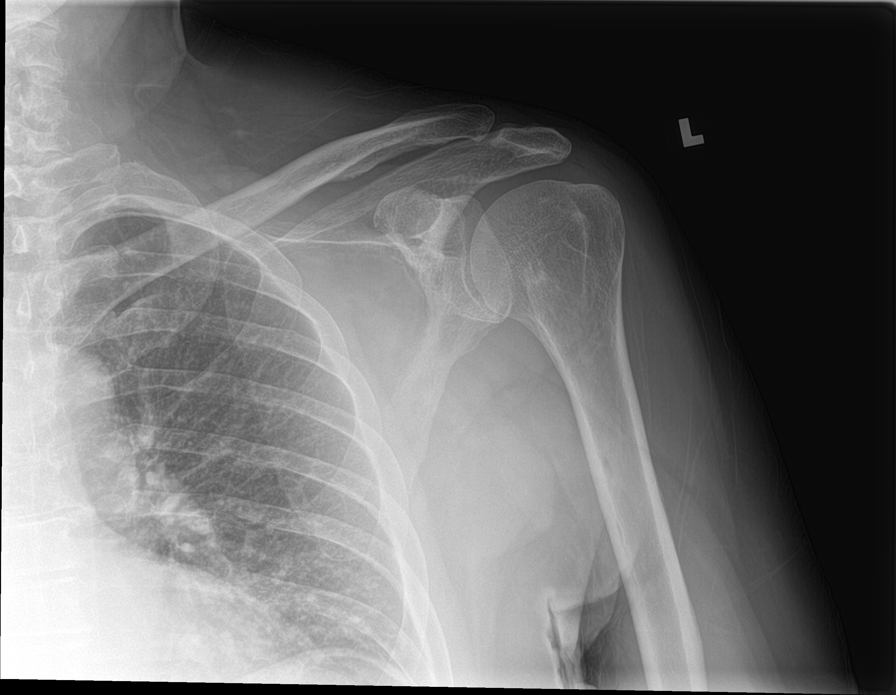

[humerus ap (1 of 2)]
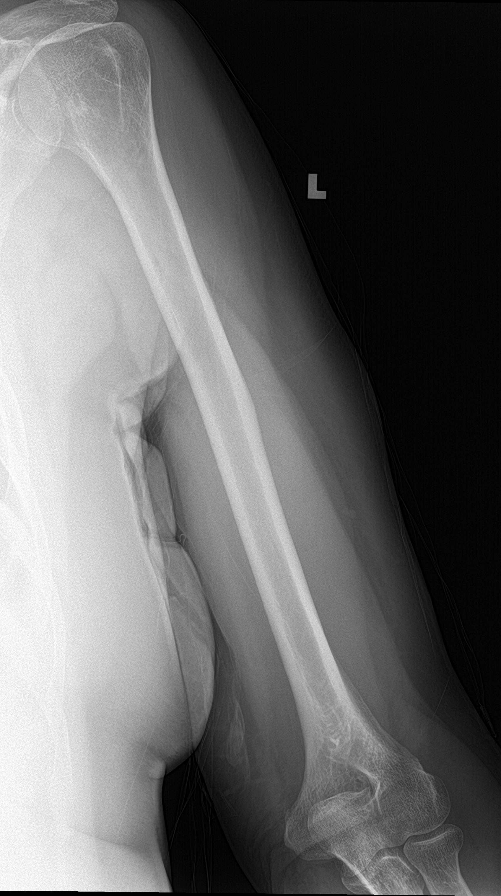

[humerus ap (2 of 2)]
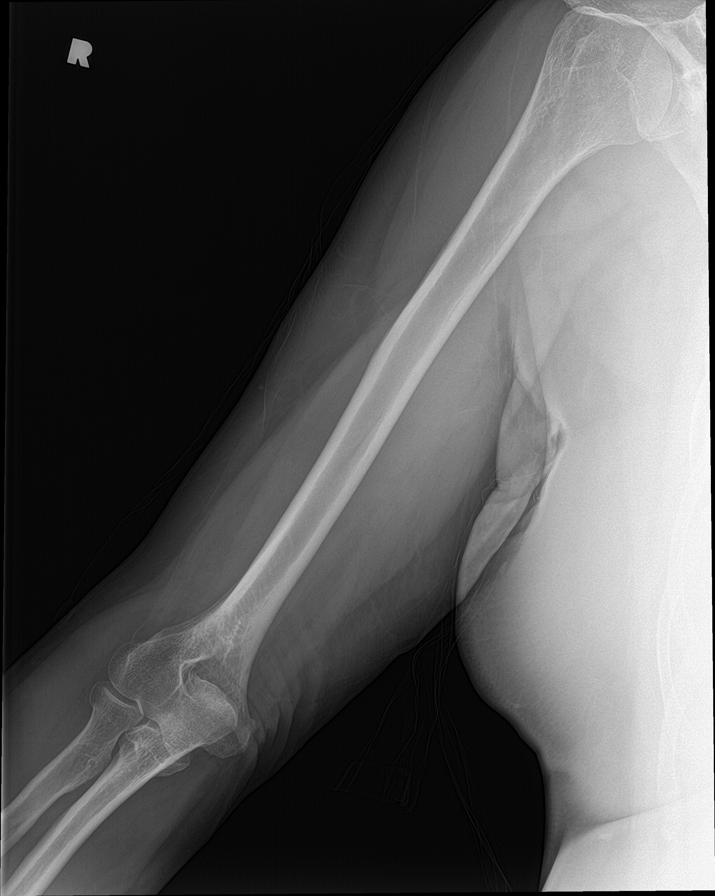

[forearm ap (1 of 2)]
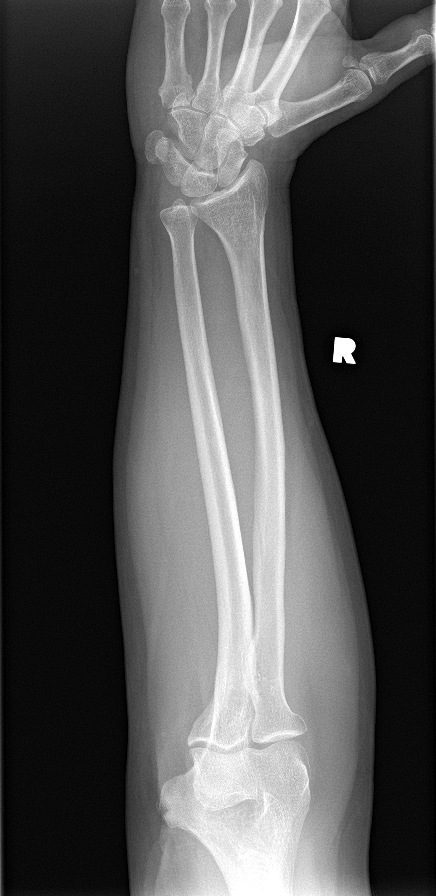

[forearm ap (2 of 2)]
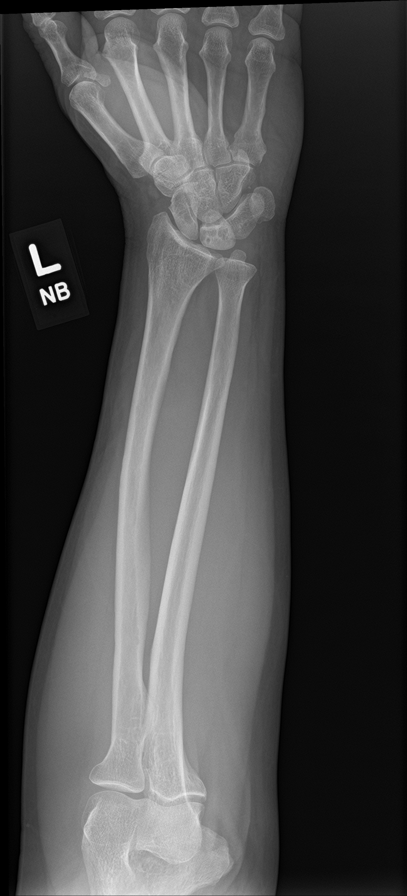

[c-spine ap]
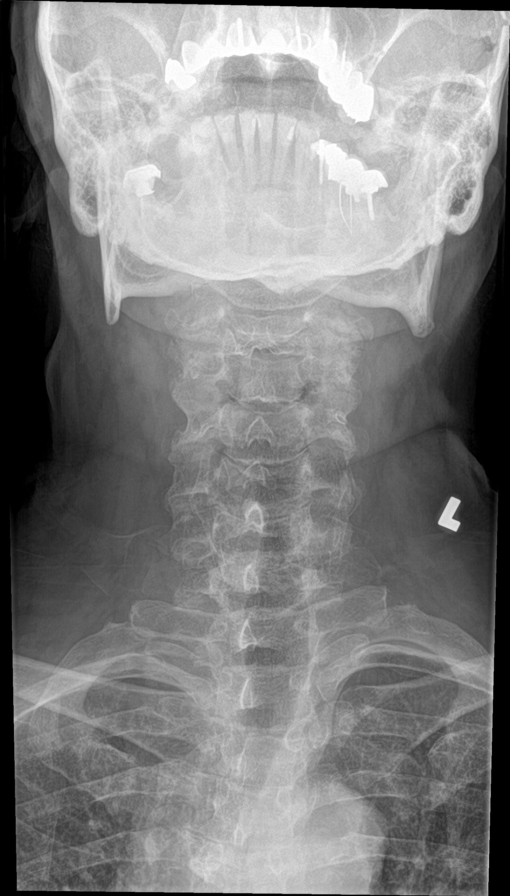

[t-spine ap]
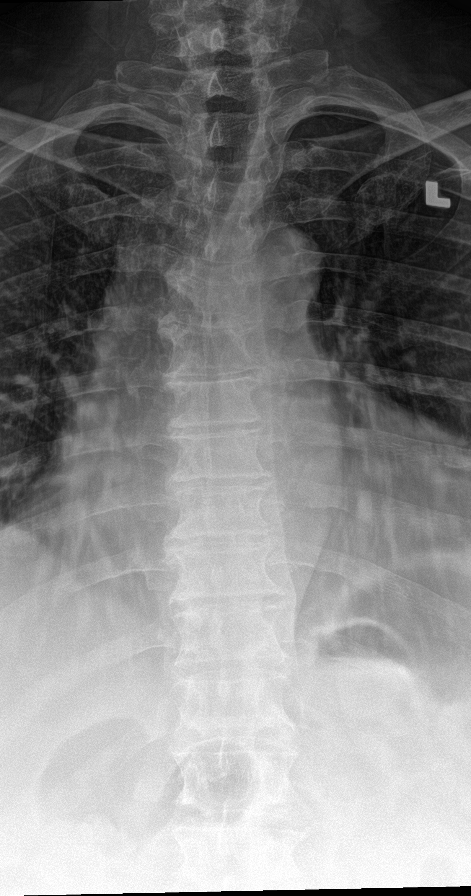

[t-spine lat]
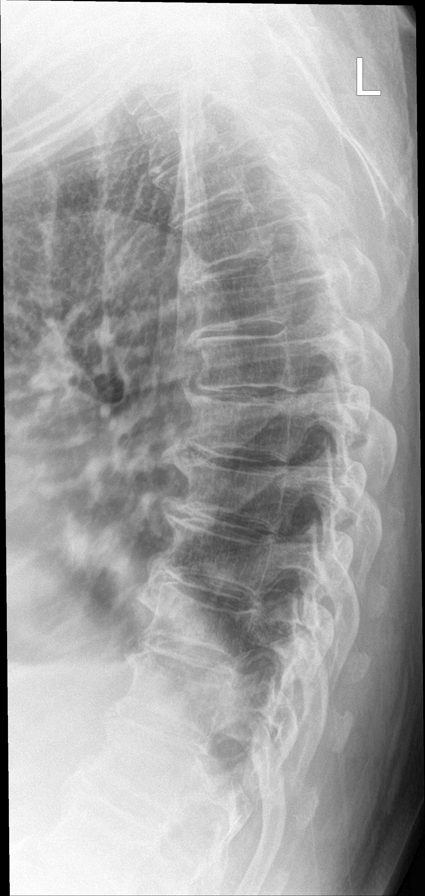

[10 of 10 positions shown; findings below may reference images not displayed]

FINDINGS: Subcentimeter lucent lesion is seen within the upper third of the
left humeral diaphysis. No other focal bone lesions are seen. There
is diffuse degenerative change throughout the spine. Degenerative
carpal bone cysts, left greater than right. Clips in the region of
the prostate. There is subcutaneous soft tissue edema of both calfs.
IMPRESSION: Nonspecific subcentimeter lucent lesion within the left humeral
diaphysis. No other focal bone lesions.

## 2021-08-03 ENCOUNTER — Encounter: Payer: Self-pay | Admitting: Physician Assistant

## 2021-08-03 ENCOUNTER — Encounter: Payer: Self-pay | Admitting: Medical-Surgical

## 2021-08-03 ENCOUNTER — Other Ambulatory Visit: Payer: Self-pay | Admitting: Specialist

## 2021-08-03 DIAGNOSIS — S0452XA Injury of facial nerve, left side, initial encounter: Secondary | ICD-10-CM

## 2021-08-06 ENCOUNTER — Ambulatory Visit: Payer: Medicare Other | Admitting: Medical-Surgical

## 2021-08-09 NOTE — Progress Notes (Signed)
Acute Office Visit  Subjective:    Patient ID: Kevin Stuart, male    DOB: 1951-04-11, 70 y.o.   MRN: [id number]  Chief Complaint  Patient presents with   Follow-up    Bilateral feet swelling     HPI Last seen at this office on 07/20/21 for worsening bilateral lower extremity edema. History of chronic BLE edema; has been on amlodipine for awhile. He deferred diuretics in the past due to urinary issues, but was agreeable to try starting at last appointment. We started HCTZ 12.5 and cut his amlodipine in half (69m daily). BMP  was WNL apart from K of 3.4 - will recheck today. BNP was normal. PMI significant for HLD, HTN, overactive bladder, BPH, obesity, dependent edema.   08/10/2021 Follow-up for BP and edema: Today patient reports his BLE swelling might be a little bit better.  States he decided to stop taking the Norvasc altogether because another provider agreed that could be contributing to the edema.  States he also stopped taking his losartan as well because he is trying to cut back on how much medicine he was taking.  He has not been checking his blood pressure at home, but denies chest pain, shortness of breath, recurrent headaches, vision changes, palpitations, cramping.  States he does have some occasional bilateral lower extremity pain primarily in the feet and ankles when the swelling is more significant. Location of pain fluctuates, pain is not severe. He denies any posterior calf pain and negative Homans' sign.  No redness or warmth.  Regarding his mildly low potassium at last visit, he has been trying to eat more potassium rich foods.   He also casually mentions some bilateral lower quadrant/groin discomfort that comes and goes.  States seems to be improved after having a bowel movement.  States it is not very noticeable today.  Reports he did have a prostate procedure in the past and is wondering if the insertion sites may have some scar tissue that is making him sore - states he  mentioned it to that provider and they did not think that was likely.  He denies any bladder/bowel changes, nausea, vomiting, fevers, other significant abdominal pain, blood in urine/stool.   He is requesting to have his A1c and lipids added to blood work today since it has been a while.  No symptoms.   Past Medical History:  Diagnosis Date   Generalized pruritus 02/03/2012   Hyperlipidemia    Hypertension 03/07/2012   Itching 07/04/2012   OCD (obsessive compulsive disorder) 03/23/2012   Overactive bladder    Seborrhea capitis 02/03/2012    Past Surgical History:  Procedure Laterality Date   GASTRIC BYPASS     VASECTOMY      Family History  Problem Relation Age of Onset   Breast cancer Mother     Social History   Socioeconomic History   Marital status: Married    Spouse name: Not on file   Number of children: Not on file   Years of education: Not on file   Highest education level: Not on file  Occupational History   Not on file  Tobacco Use   Smoking status: Never   Smokeless tobacco: Never  Vaping Use   Vaping Use: Never used  Substance and Sexual Activity   Alcohol use: Yes    Comment: Rarely   Drug use: Never   Sexual activity: Not Currently    Partners: Female    Birth control/protection: Abstinence  Other Topics Concern  Not on file  Social History Narrative   Not on file   Social Determinants of Health   Financial Resource Strain: Not on file  Food Insecurity: Not on file  Transportation Needs: Not on file  Physical Activity: Not on file  Stress: Not on file  Social Connections: Not on file  Intimate Partner Violence: Not on file    Outpatient Medications Prior to Visit  Medication Sig Dispense Refill   hydrochlorothiazide (HYDRODIURIL) 12.5 MG tablet Take 1 tablet (12.5 mg total) by mouth daily. 30 tablet 3   pravastatin (PRAVACHOL) 40 MG tablet Take 1 tablet (40 mg total) by mouth daily. 90 tablet 1   valACYclovir (VALTREX) 1000 MG tablet Take  1,000 mg by mouth 3 (three) times daily.     losartan (COZAAR) 25 MG tablet Take 25 mg by mouth daily. 12.42m     amLODipine (NORVASC) 10 MG tablet Take 0.5 tablets (5 mg total) by mouth daily. (Patient not taking: Reported on 08/10/2021) 90 tablet 1   celecoxib (CELEBREX) 200 MG capsule One to 2 tablets by mouth daily as needed for pain. (Patient not taking: Reported on 08/10/2021) 60 capsule 2   No facility-administered medications prior to visit.    No Known Allergies  Review of Systems All review of systems negative except what is listed in the HPI     Objective:    Physical Exam Vitals reviewed.  Constitutional:      Appearance: Normal appearance. He is obese.  HENT:     Head: Normocephalic and atraumatic.  Cardiovascular:     Rate and Rhythm: Normal rate and regular rhythm.     Heart sounds: Normal heart sounds.  Pulmonary:     Effort: Pulmonary effort is normal.     Breath sounds: Normal breath sounds. No rhonchi or rales.  Abdominal:     General: Abdomen is flat. Bowel sounds are normal. There is no distension.     Palpations: Abdomen is soft. There is no mass.     Tenderness: There is no abdominal tenderness. There is no guarding or rebound.  Musculoskeletal:        General: Normal range of motion.     Right lower leg: Edema present.     Left lower leg: Edema present.     Comments: +2 BLE edema; negative Homans signs, no erythema, warmth, or calf pain  Skin:    General: Skin is warm and dry.     Capillary Refill: Capillary refill takes less than 2 seconds.     Findings: No erythema.  Neurological:     General: No focal deficit present.     Mental Status: He is alert and oriented to person, place, and time. Mental status is at baseline.  Psychiatric:        Mood and Affect: Mood normal.        Behavior: Behavior normal.        Thought Content: Thought content normal.        Judgment: Judgment normal.    BP (!) 139/94 (BP Location: Left Arm, Patient Position:  Sitting, Cuff Size: Large)   Pulse 79   Temp 98.8 F (37.1 C) (Oral)   Wt (!) 300 lb 0.6 oz (136.1 kg)   BMI 44.96 kg/m  Wt Readings from Last 3 Encounters:  08/10/21 (!) 300 lb 0.6 oz (136.1 kg)  07/20/21 (!) 301 lb 1.9 oz (136.6 kg)  07/15/21 (!) 303 lb 6.4 oz (137.6 kg)    Health Maintenance Due  Topic Date Due   COLONOSCOPY (Pts 45-23yr Insurance coverage will need to be confirmed)  07/18/2021    There are no preventive care reminders to display for this patient.   No results found for: TSH Lab Results  Component Value Date   WBC 7.7 12/08/2020   HGB 14.0 12/08/2020   HCT 42.6 12/08/2020   MCV 81.1 12/08/2020   PLT 284 12/08/2020   Lab Results  Component Value Date   NA 142 07/20/2021   K 3.4 (L) 07/20/2021   CO2 27 07/20/2021   GLUCOSE 119 (H) 07/20/2021   BUN 19 07/20/2021   CREATININE 1.20 07/20/2021   BILITOT 0.4 12/08/2020   ALKPHOS 76 02/23/2012   AST 19 12/08/2020   ALT 21 12/08/2020   PROT 7.4 12/08/2020   ALBUMIN 4.3 02/23/2012   CALCIUM 9.6 07/20/2021   EGFR 66 07/20/2021   Lab Results  Component Value Date   CHOL 230 (H) 10/23/2012   Lab Results  Component Value Date   HDL 49 10/23/2012   Lab Results  Component Value Date   LDLCALC 159 (H) 10/23/2012   Lab Results  Component Value Date   TRIG 112 10/23/2012   Lab Results  Component Value Date   CHOLHDL 4.7 10/23/2012   Lab Results  Component Value Date   HGBA1C 5.6 08/22/2012       Assessment & Plan:   Problem List Items Addressed This Visit       Cardiovascular and Mediastinum   Hypertension, essential    Continue holding the amlodipine - we can see if this is helps your swelling Restart Losartan 25 mg - refill sent to pharmacy Continue Hydrochlorothiazide 12.5 mg Monitor blood pressure once a day or every other day at home for the next 2 weeks or so Try compression socks - may need to get a bigger size. Put them on first thing in the morning before getting out of  bed.  Elevate legs as much as possible  Rechecking labs today      Relevant Medications   losartan (COZAAR) 25 MG tablet   Other Relevant Orders   Basic metabolic panel   HgB AW2N  Lipid panel     Other   Dependent edema    Continue holding the amlodipine - we can see if this is helps your swelling Restart Losartan 25 mg - refill sent to pharmacy Continue Hydrochlorothiazide 12.5 mg Monitor blood pressure once a day or every other day at home for the next 2 weeks or so Try compression socks - may need to get a bigger size. Put them on first thing in the morning before getting out of bed.  Elevate legs as much as possible  Rechecking labs today      Other Visit Diagnoses     Bilateral lower extremity edema    -  Primary   Relevant Orders   Basic metabolic panel   Abdominal pain, unspecified abdominal location       Relevant Orders   CBC   Urinalysis   Elevated hemoglobin A1c       Relevant Orders   HgB A1c     For abdominal/groin discomfort: reassuring that pain resolves after having a BM and is not severe in nature; varies location and timing.  Checking urine today Basic labs today Pain improves with bowel movement, so please keep a diary of your symptoms, diet, and bowel movements If pain becomes worse or more consistent please let [place]  Meds ordered this encounter  Medications   losartan (COZAAR) 25 MG tablet    Sig: Take 1 tablet (25 mg total) by mouth daily. Take 25 mg by mouth daily.    Dispense:  30 tablet    Refill:  3   Patient aware of signs/symptoms requiring further/urgent evaluation.   Follow-up in 2 weeks for BP check (nurse visit). We will update you with lab results and any changes to plan of care.    Pur[facility] Nails Olevia Bowens, DNP, FNP-C

## 2021-08-10 ENCOUNTER — Ambulatory Visit (INDEPENDENT_AMBULATORY_CARE_PROVIDER_SITE_OTHER): Payer: Medicare Other

## 2021-08-10 ENCOUNTER — Ambulatory Visit (INDEPENDENT_AMBULATORY_CARE_PROVIDER_SITE_OTHER): Payer: Medicare Other | Admitting: Family Medicine

## 2021-08-10 ENCOUNTER — Other Ambulatory Visit: Payer: Self-pay

## 2021-08-10 ENCOUNTER — Encounter: Payer: Self-pay | Admitting: Family Medicine

## 2021-08-10 VITALS — BP 139/94 | HR 79 | Temp 98.8°F | Wt 300.0 lb

## 2021-08-10 DIAGNOSIS — R609 Edema, unspecified: Secondary | ICD-10-CM

## 2021-08-10 DIAGNOSIS — R600 Localized edema: Secondary | ICD-10-CM | POA: Diagnosis not present

## 2021-08-10 DIAGNOSIS — S0452XA Injury of facial nerve, left side, initial encounter: Secondary | ICD-10-CM

## 2021-08-10 DIAGNOSIS — R7309 Other abnormal glucose: Secondary | ICD-10-CM

## 2021-08-10 DIAGNOSIS — G519 Disorder of facial nerve, unspecified: Secondary | ICD-10-CM

## 2021-08-10 DIAGNOSIS — R109 Unspecified abdominal pain: Secondary | ICD-10-CM | POA: Diagnosis not present

## 2021-08-10 DIAGNOSIS — I10 Essential (primary) hypertension: Secondary | ICD-10-CM | POA: Diagnosis not present

## 2021-08-10 DIAGNOSIS — N3000 Acute cystitis without hematuria: Secondary | ICD-10-CM

## 2021-08-10 DIAGNOSIS — E876 Hypokalemia: Secondary | ICD-10-CM

## 2021-08-10 DIAGNOSIS — R253 Fasciculation: Secondary | ICD-10-CM | POA: Diagnosis not present

## 2021-08-10 IMAGING — MR MR HEAD WO/W CM
12 series · 48 of 48 positions shown · IV contrast (gadavist)
Comparison: Cervical spine MRI [DATE].

CLINICAL DATA: 70-year-old male with left eye twitching for 2-3
weeks. "Facial neuropathy". No known injury.

EXAM:
MRI HEAD WITHOUT AND WITH CONTRAST
TECHNIQUE: Multiplanar, multiecho pulse sequences of the brain and surrounding
structures were obtained without and with intravenous contrast.
CONTRAST:  10mL GADAVIST GADOBUTROL 1 MMOL/ML IV SOLN

[Series 2: DWI · axial · 3.0mm · 1.46mm/px · z∈[-30,+134]mm · 8 of 116 slices shown (1 of 4)]
[im 1/116]
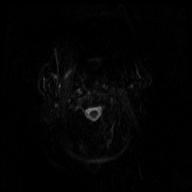
[im 17/116]
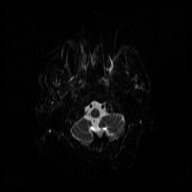
[im 33/116]
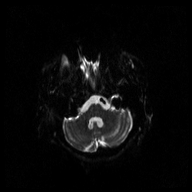
[im 50/116]
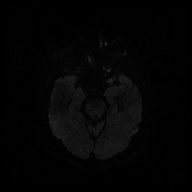
[im 66/116]
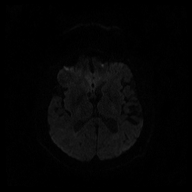
[im 83/116]
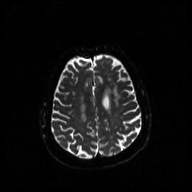
[im 99/116]
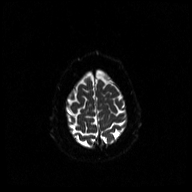
[im 116/116]
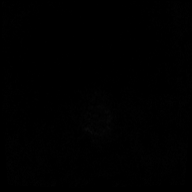

[Series 3: DWI · axial · 3.0mm · 1.46mm/px · z∈[-30,+134]mm · 4 of 58 slices shown (2 of 4)]
[im 1/58]
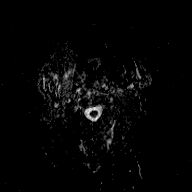
[im 20/58]
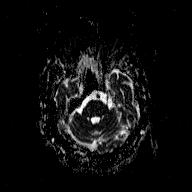
[im 39/58]
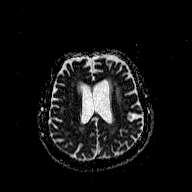
[im 58/58]
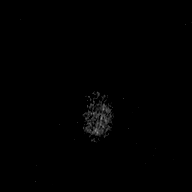

[Series 4: DWI · coronal · 3.0mm · 1.46mm/px · 5 of 86 slices shown (3 of 4)]
[im 1/86]
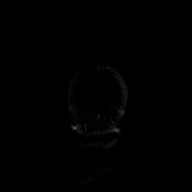
[im 22/86]
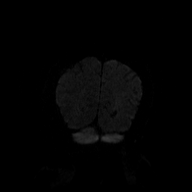
[im 43/86]
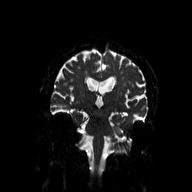
[im 64/86]
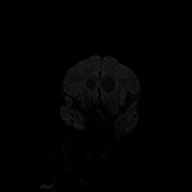
[im 86/86]
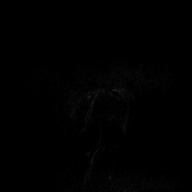

[Series 5: DWI · coronal · 3.0mm · 1.46mm/px · 3 of 46 slices shown (4 of 4)]
[im 1/46]
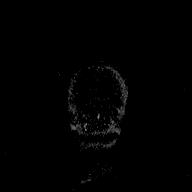
[im 23/46]
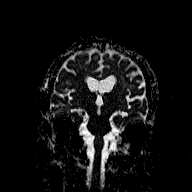
[im 46/46]
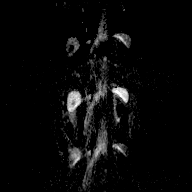

[Series 6: T1 · sagittal · 5.0mm · 0.47mm/px · 1 of 25 slices shown (1 of 2)]
[im 1/25]
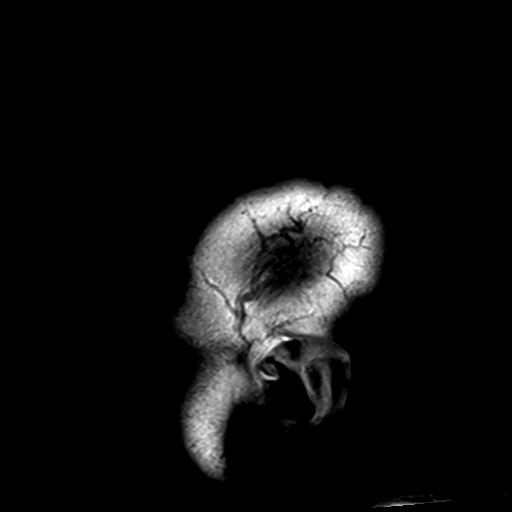

[Series 7: T2 · axial · 5.0mm · 0.72mm/px · 1 of 25 slices shown (1 of 2)]
[im 1/25]
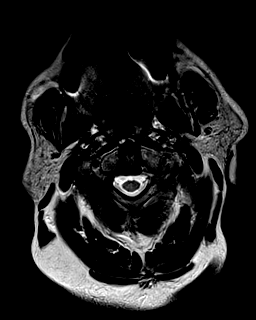

[Series 8: FLAIR · axial · 3.0mm · 0.45mm/px · z∈[-40,+117]mm · 3 of 55 slices shown]
[im 1/55]
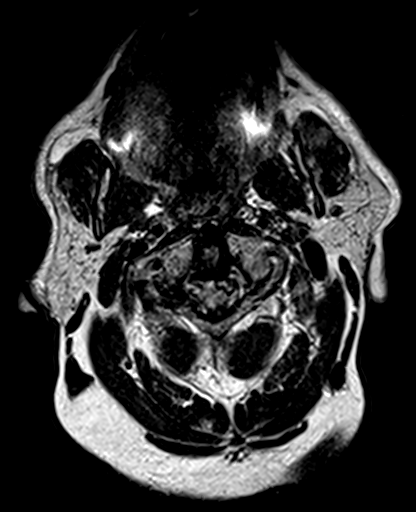
[im 28/55]
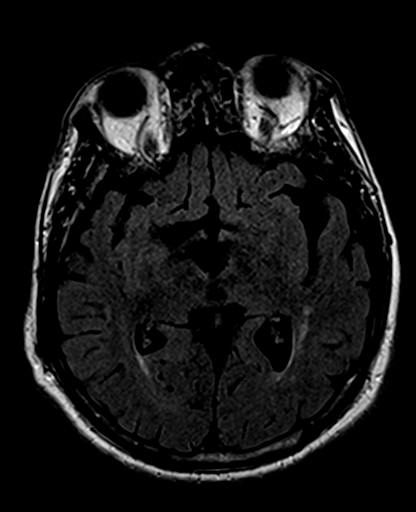
[im 55/55]
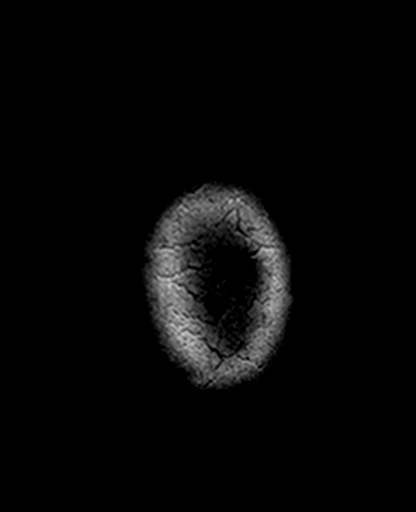

[Series 9: T2 · axial · 5.0mm · 0.72mm/px · 1 of 25 slices shown (2 of 2)]
[im 1/25]
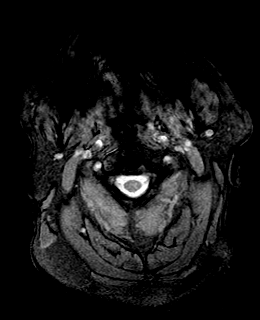

[Series 10: T1 · axial · 1.0mm · 0.94mm/px · z∈[-33,+120]mm · 9 of 160 slices shown (2 of 2)]
[im 1/160]
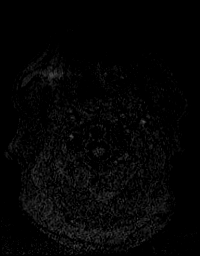
[im 20/160]
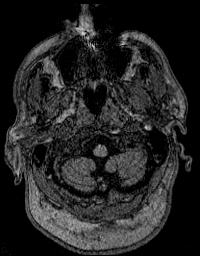
[im 40/160]
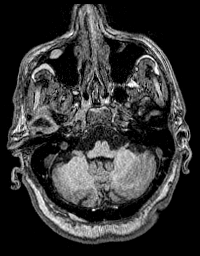
[im 60/160]
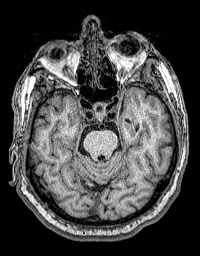
[im 80/160]
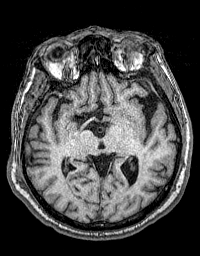
[im 100/160]
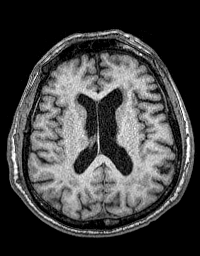
[im 120/160]
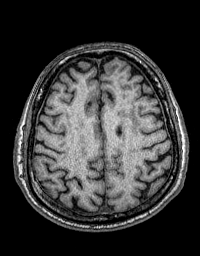
[im 140/160]
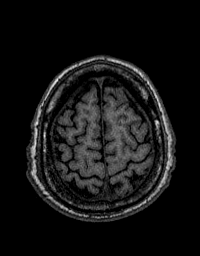
[im 160/160]
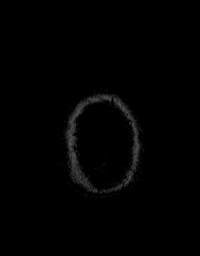

[Series 12: T2 post-contrast · coronal · 5.0mm · 0.43mm/px · 2 of 30 slices shown]
[im 1/30]
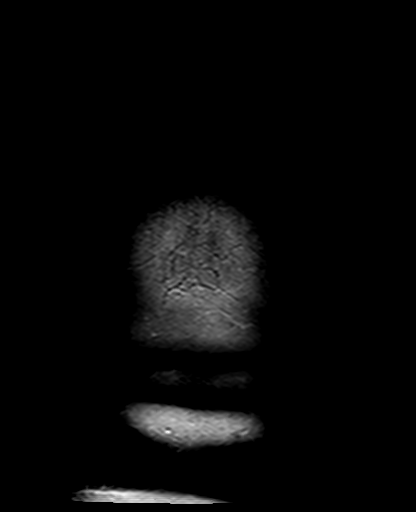
[im 30/30]
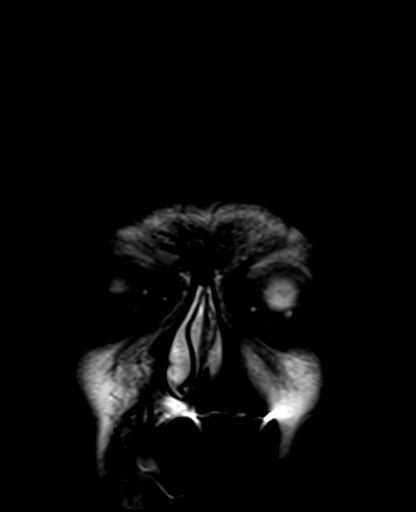

[Series 13: T1 post-contrast · axial · 1.0mm · 0.94mm/px · z∈[-33,+122]mm · 9 of 160 slices shown (1 of 2)]
[im 1/160]
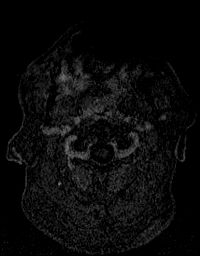
[im 20/160]
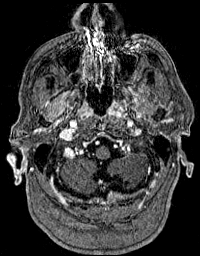
[im 40/160]
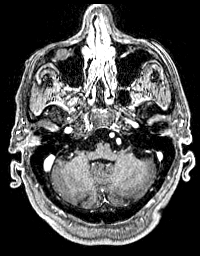
[im 60/160]
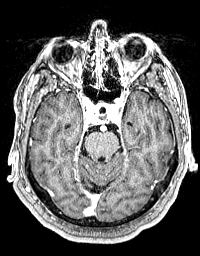
[im 80/160]
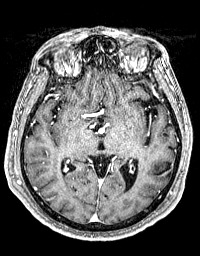
[im 100/160]
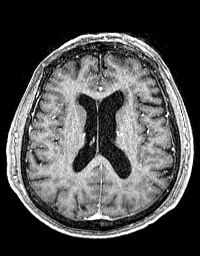
[im 120/160]
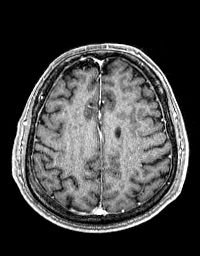
[im 140/160]
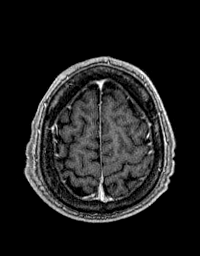
[im 160/160]
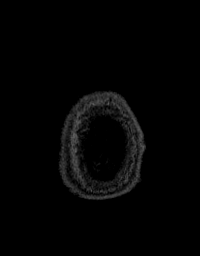

[Series 14: T1 post-contrast · coronal · 5.0mm · 0.43mm/px · 2 of 30 slices shown (2 of 2)]
[im 1/30]
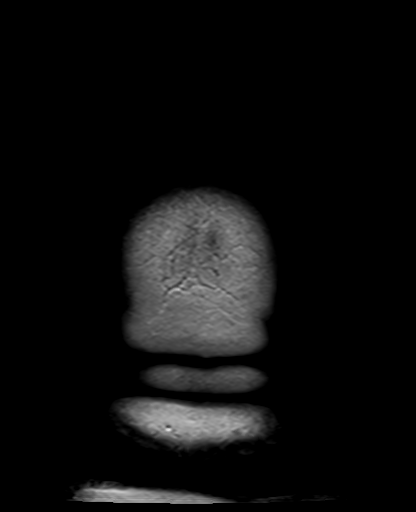
[im 30/30]
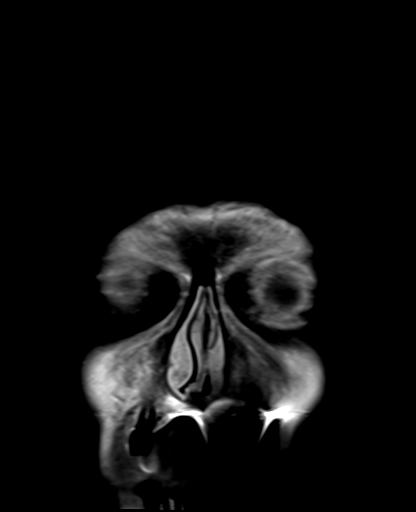

[48 of 48 positions shown; findings below may reference images not displayed]

FINDINGS: Brain: No restricted diffusion to suggest acute infarction. No
midline shift, mass effect, evidence of mass lesion,
ventriculomegaly, extra-axial collection or acute intracranial
hemorrhage. Cervicomedullary junction and pituitary are within
normal limits.

Evidence of mid brain volume loss (sagittal image 26). But no
cortical encephalomalacia or chronic cerebral blood products
identified. Widespread Patchy and confluent, moderate for age
nonspecific cerebral white matter T2 and FLAIR heterogeneity. But
signal in the deep gray matter nuclei, brainstem and cerebellum is
normal.

No abnormal enhancement identified.  No dural thickening.

Vascular: Major intracranial vascular flow voids are preserved. Mild
to moderate generalized tortuosity of the intracranial arteries. The
major dural venous sinuses are enhancing and appear to be patent.

Skull and upper cervical spine: Negative visible cervical spine.
Visualized bone marrow signal is within normal limits.

Sinuses/Orbits: Negative orbits. Paranasal sinuses are well aerated,
there is a small anterior right maxillary sinus mucous retention
cyst. Trace sinus mucosal thickening otherwise.

Other: Tortuous posterior circulation, and left PICA or AICA might
appears to result in mild mass effect on the left [DATE] cranial nerve
root entry zone of the cerebellopontine angle. See series 7, image 8
and series 13 image 43. Otherwise Visible internal auditory
structures appear normal. No abnormal enhancement of the visible
left facial nerve, and the left stylomastoid foramen, parotid gland
appear normal.

Negative visible scalp and face soft tissues.
IMPRESSION: 1. Generalized intracranial artery tortuosity. Query chronic
Hypertension.
Tortuous left PICA or AICA which seems to displace the cisternal
left 7th/8th Cranial Nerves and might exert mild mass effect on the
nerve root entry zone at the brainstem. And such an appearance has
been correlated with Hemifacial Spasm.

2. No acute intracranial abnormality. Moderately advanced cerebral
white matter signal changes are nonspecific but most commonly due to
chronic small vessel disease.

## 2021-08-10 MED ORDER — GADOBUTROL 1 MMOL/ML IV SOLN
10.0000 mL | Freq: Once | INTRAVENOUS | Status: AC | PRN
  Administered 2021-08-10: 10 mL via INTRAVENOUS

## 2021-08-10 MED ORDER — LOSARTAN POTASSIUM 25 MG PO TABS: 25.0000 mg | ORAL_TABLET | Freq: Every day | ORAL | 3 refills | Status: AC

## 2021-08-10 NOTE — Patient Instructions (Addendum)
Blood pressure and swelling: Continue holding the amlodipine - we can see if this is helps your swelling Restart Losartan 25 mg - refill sent to pharmacy Continue Hydrochlorothiazide 12.5 mg Monitor blood pressure once a day or every other day at home for the next 2 weeks or so Try compression socks - may need to get a bigger size. Put them on first thing in the morning before getting out of bed.  Elevate legs as much as possible  Rechecking labs today  Lower Abdominal/Groin discomfort: Checking urine today Basic labs today Pain improves with bowel movement, so please keep a diary of your symptoms, diet, and bowel movements If pain becomes worse or more consistent please let us know  If symptoms become severe, go to the ED.   Follow-up in 2 weeks for nurse visit BP check

## 2021-08-10 NOTE — Assessment & Plan Note (Signed)
Continue holding the amlodipine - we can see if this is helps your swelling Restart Losartan 25 mg - refill sent to pharmacy Continue Hydrochlorothiazide 12.5 mg Monitor blood pressure once a day or every other day at home for the next 2 weeks or so Try compression socks - may need to get a bigger size. Put them on first thing in the morning before getting out of bed.  Elevate legs as much as possible  Rechecking labs today

## 2021-08-11 LAB — URINALYSIS
Bilirubin Urine: NEGATIVE
Glucose, UA: NEGATIVE
Hgb urine dipstick: NEGATIVE
Ketones, ur: NEGATIVE
Nitrite: POSITIVE — AB
Protein, ur: NEGATIVE
Specific Gravity, Urine: 1.014 (ref 1.001–1.035)
pH: 7 (ref 5.0–8.0)

## 2021-08-11 LAB — BASIC METABOLIC PANEL
BUN: 18 mg/dL (ref 7–25)
CO2: 30 mmol/L (ref 20–32)
Calcium: 9.6 mg/dL (ref 8.6–10.3)
Chloride: 102 mmol/L (ref 98–110)
Creat: 1.17 mg/dL (ref 0.70–1.35)
Glucose, Bld: 89 mg/dL (ref 65–139)
Potassium: 3.4 mmol/L — ABNORMAL LOW (ref 3.5–5.3)
Sodium: 142 mmol/L (ref 135–146)

## 2021-08-11 LAB — LIPID PANEL
Cholesterol: 192 mg/dL (ref <200)
HDL: 46 mg/dL (ref >=40)
LDL Cholesterol (Calc): 123 mg/dL (calc) — ABNORMAL HIGH
Non-HDL Cholesterol (Calc): 146 mg/dL (calc) — ABNORMAL HIGH (ref <130)
Total CHOL/HDL Ratio: 4.2 (calc) (ref <5.0)
Triglycerides: 120 mg/dL (ref <150)

## 2021-08-11 LAB — CBC
HCT: 45 % (ref 38.5–50.0)
Hemoglobin: 14.3 g/dL (ref 13.2–17.1)
MCH: 26.1 pg — ABNORMAL LOW (ref 27.0–33.0)
MCHC: 31.8 g/dL — ABNORMAL LOW (ref 32.0–36.0)
MCV: 82.1 fL (ref 80.0–100.0)
MPV: 12.1 fL (ref 7.5–12.5)
Platelets: 250 10*3/uL (ref 140–400)
RBC: 5.48 10*6/uL (ref 4.20–5.80)
RDW: 14.2 % (ref 11.0–15.0)
WBC: 7.9 10*3/uL (ref 3.8–10.8)

## 2021-08-11 LAB — HEMOGLOBIN A1C
Hgb A1c MFr Bld: 5.6 % of total Hgb (ref <5.7)
Mean Plasma Glucose: 114 mg/dL
eAG (mmol/L): 6.3 mmol/L

## 2021-08-12 ENCOUNTER — Encounter: Payer: Self-pay | Admitting: Medical-Surgical

## 2021-08-12 MED ORDER — CEPHALEXIN 500 MG PO CAPS: 500.0000 mg | ORAL_CAPSULE | Freq: Two times a day (BID) | ORAL | 0 refills | Status: AC

## 2021-08-12 MED ORDER — POTASSIUM CHLORIDE CRYS ER 20 MEQ PO TBCR
40.0000 meq | EXTENDED_RELEASE_TABLET | Freq: Every day | ORAL | 0 refills | Status: AC
Start: 2021-08-12 — End: 2021-08-17

## 2021-08-12 NOTE — Addendum Note (Signed)
Addended by: Caleen Jobs B on: 08/12/2021 12:10 PM   Modules accepted: Orders

## 2021-08-13 ENCOUNTER — Other Ambulatory Visit: Payer: Self-pay

## 2021-08-13 DIAGNOSIS — R39198 Other difficulties with micturition: Secondary | ICD-10-CM

## 2021-08-25 ENCOUNTER — Ambulatory Visit: Payer: Medicare Other | Admitting: Medical-Surgical

## 2021-08-25 ENCOUNTER — Ambulatory Visit: Payer: Medicare Other

## 2021-09-08 ENCOUNTER — Other Ambulatory Visit: Payer: Self-pay | Admitting: Medical-Surgical

## 2021-09-08 DIAGNOSIS — I10 Essential (primary) hypertension: Secondary | ICD-10-CM

## 2021-09-23 ENCOUNTER — Ambulatory Visit: Payer: Medicare Other | Attending: Urology | Admitting: Urology

## 2021-09-23 ENCOUNTER — Encounter (INDEPENDENT_AMBULATORY_CARE_PROVIDER_SITE_OTHER): Payer: Self-pay | Admitting: Urology

## 2021-09-23 ENCOUNTER — Other Ambulatory Visit: Payer: Self-pay

## 2021-09-23 VITALS — BP 160/88 | HR 86 | Temp 97.0°F | Ht 68.0 in | Wt 291.9 lb

## 2021-09-23 DIAGNOSIS — Z9889 Other specified postprocedural states: Secondary | ICD-10-CM

## 2021-09-23 DIAGNOSIS — R3912 Poor urinary stream: Secondary | ICD-10-CM | POA: Insufficient documentation

## 2021-09-23 DIAGNOSIS — R350 Frequency of micturition: Secondary | ICD-10-CM

## 2021-09-23 DIAGNOSIS — N401 Enlarged prostate with lower urinary tract symptoms: Secondary | ICD-10-CM

## 2021-09-23 DIAGNOSIS — R351 Nocturia: Secondary | ICD-10-CM

## 2021-09-23 DIAGNOSIS — N368 Other specified disorders of urethra: Secondary | ICD-10-CM

## 2021-09-23 DIAGNOSIS — G4733 Obstructive sleep apnea (adult) (pediatric): Secondary | ICD-10-CM

## 2021-09-23 NOTE — H&P (Signed)
[facility]     PATIENT NAME: Aaron Gallegos NUMBER: [id number]   DATE OF SERVICE: 09/23/2021   DATE OF BIRTH: 1951-08-08      Chief Complaint:   Chief Complaint   Patient presents with    Other     Eval abnormal urinary stream        HPI: Aaron Gallegos is a 70 y.o. 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  Earlier this year the patient had urinary retention in February for which he had indwelling Foley catheter for 5 months until he received urolift procedure for enlarged prostate in July.  He had ventral erosion of the distal urethra because of the indwelling catheter and now he has deviated urinary stream that prohibits him from urinating while standing.  He also has a history of prostatic artery embolization in 2018 for enlarged prostate as well.  He is also complaining of severe irritative lower urinary tract symptoms including frequency and nocturia.  He mentioned that he can go for more than hour in before having to urinate.  Also during the night he wakes up about 4 or 5 times.  He mentioned that his urologist was going to perform diagnostic procedures for him to evaluate this complaint.  He also mentioned that he was diagnosed with sleep apnea years ago and was recommended to have CPAP during the night, however the patient is not using it.     PMHx:   HTN  HLD  CKD  BPH  Obesity   Dependent edema   Benign neoplasm of pituitary gland     PSHx:   Urolift  Prostate artery embolization   Pituitary surgery           Family Hx: Marland Kitchen  Family Medical History:    None           Social Hx:   Social History     Socioeconomic History    Marital status: Divorced     Spouse name: Not on file    Number of children: Not on file    Years of education: Not on file    Highest education level: Not on file   Occupational History    Not on file    Tobacco Use    Smoking status: Never    Smokeless tobacco: Never   Substance and Sexual Activity    Alcohol use: Never    Drug use: Never    Sexual activity: Not on file   Other Topics Concern    Not on file   Social History Narrative    Not on file     Social Determinants of Health     Financial Resource Strain: Not on file   Food Insecurity: Not on file   Transportation Needs: Not on file   Physical Activity: Not on file   Stress: Not on file   Intimate Partner Violence: Not on file   Housing Stability: Not on file       Medications:   Current Outpatient Medications   Medication Sig    amLODIPine (NORVASC) 10 mg Oral Tablet Take 1 Tablet (10 mg total) by mouth Once a day    pravastatin (PRAVACHOL) 40 mg Oral Tablet        Allergies: No Known Allergies    ROS:  All 10 systems were reviewed.  All other  systems are negative.    Physical Exam:  BP (!) 160/88   Pulse 86   Temp 36.1 C (97 F) (Thermal Scan)   Ht 1.727 m (5\' 8" )   Wt 132 kg (291 lb 14.2 oz)   SpO2 97% Comment: room air  BMI 44.38 kg/m       General - alert/oriented; NAD  Eyes - conjunctiva clear, PERRL,   HENT - Normocephalic, atraumatic. Pinnae normal, nasal septum midline, trachea midline and neck soft and supple  Pulmonary- Unlabored breathing with symmetric chest rise on room air. Lungs clear to auscultation bilaterally  Gastrointestinal- soft, NT/ND.   Musculoskeletal - Full range of motion in all 4 extremities  Neurological - CN II-XII Grossly intact  GU system - mega-meatus due to ventral urethral erosion distal to the coronal sulcus limited to the glans with some fibrositis at the ventral aspect   Rectal- deferred   Skin - warm and dry   Psych - Normal affect    Urine Dip Results:  Urine Dip Results:   Time collected: 1533  Glucose (Ref Range: Negative mg/dL): Negative  Bilirubin (Ref Range: Negative mg/dL): Negative  Ketones (Ref Range: Negative mg/dL): Negative  Urine Specific Gravity (Ref Range: 1.005 - 1.030): 1.010  Blood (urine)  (Ref Range: Negative mg/dL): (!) Trace Hemolyzed  pH (Ref Range: 5.0 - 8.0): 7.5  Protein (Ref Range: Negative mg/dL): (!) Trace  Urobilinogen (Ref Range: Negative mg/dL): Normal   Nitrite (Ref Range: Negative): (!) Positive  Leukocytes (Ref Range: Negative WBC's/uL): (!) Small  PVR Volume: 25    Imaging  None       Assessment:  70 y.o. male with   1. Ventral urethral erosion   2. Abnormal urinary stream   3. BPH s\p PAE (2018) and Urolift (04/2021)   4. Irritative LUTS (frequency and nocturia)   5. OSA     Plan:  We discussed with the patient that management of the urethral meatus distally requires that we have to ensure that his prostate problems were adequately treated first to avoid any future catheterizations or endoscopy procedures through the repair.  We also discussed the workup prior to any surgical intervention which include urethral calibration and urethroscopy with urethrogram.  The patient will follow up with his local urologist  later this month to address the irritative lower inner tract symptoms and evaluate his BPH treatment.  We will schedule him in 2-3 months for urethral calibration and urethrogram to evaluate his urethra and discuss options for treatment.  We also discussed with the patient the expected outcome from surgical intervention and that urinary stream abnormalities can persist after he kind of urethral reconstruction or repair, however our objective from his surgery is to reconstruct his urethra meatus to the degree that he would be able to urinate while standing.    Carlena Hurl, MD   Department of Urology  [place]  09/23/2021, 17:20      I saw and examined the patient.  I reviewed the resident's note.  I agree with the findings and plan of care as documented in the resident's note.  Any exceptions/additions are edited/noted.    Additional Attending Documentation: Patient with obstructive LUTS as well as issues with deflection or spraying of urinary stream. He has ventral urethral  erosion and is requesting tubularization. I discussed that he should see his home urologist to ensure no surgery is planned/indicated for his prostate. After resolution of this issue we will plan for urethral workup with cystoscopy, calibration,  RUG as indicated and can discuss options. These could include doing nothing, staged repair with BMG, Dorsal inlay BMG with 1 stage tubularization. This is to be determined. In general I discussed that surgery for the indication of deflection of urinary stream and/or spraying is tricky because the procedure itself will by nature change the urinary stream. Sometimes there is residual or new deflection or abnormalities of the urinary stream. He understands and we will see him in 2-3 months for the urethral workup. This can be postponed if he is to have prostatic surgery as I would anticipate it would involve a period of catheterization and we would want 2-3 months without the catheter for urethral rest prior to any definitive recommendations regarding the urethra.    Earnie Larsson, MD 09/27/2021, 21:33  Genitourinary Reconstructive Surgery  Assistant Professor - Department of Urology

## 2021-12-07 ENCOUNTER — Ambulatory Visit (HOSPITAL_COMMUNITY): Admit: 2021-12-07 | Payer: Self-pay | Admitting: Urology

## 2021-12-07 ENCOUNTER — Encounter (HOSPITAL_COMMUNITY): Payer: Self-pay

## 2021-12-07 SURGERY — CALIBRATION URETHRAL
Anesthesia: Local (Nurse-Monitored) | Site: Urethra

## 2022-03-08 ENCOUNTER — Other Ambulatory Visit: Payer: Self-pay

## 2022-03-08 DIAGNOSIS — I10 Essential (primary) hypertension: Secondary | ICD-10-CM

## 2022-03-08 MED ORDER — AMLODIPINE BESYLATE 10 MG PO TABS
ORAL_TABLET | ORAL | 0 refills | Status: AC
Start: 2022-03-08 — End: ?

## 2022-06-09 ENCOUNTER — Encounter: Payer: Self-pay | Admitting: General Practice

## 2022-12-06 ENCOUNTER — Telehealth: Payer: Self-pay | Admitting: Medical-Surgical

## 2022-12-06 NOTE — Telephone Encounter (Signed)
Left voicemail for patient to call and schedule annual wellness visit at number on file. Kevin Stuart
